# Patient Record
Sex: Female | Born: 1985 | Race: Black or African American | Hispanic: No | Marital: Single | Smoking: Never smoker
Health system: Southern US, Community
[De-identification: ages and names within clinical notes are randomized; demographics above are authoritative.]

## PROBLEM LIST (undated history)

## (undated) DIAGNOSIS — K219 Gastro-esophageal reflux disease without esophagitis: Secondary | ICD-10-CM

## (undated) DIAGNOSIS — E162 Hypoglycemia, unspecified: Secondary | ICD-10-CM

## (undated) DIAGNOSIS — T8859XA Other complications of anesthesia, initial encounter: Secondary | ICD-10-CM

## (undated) DIAGNOSIS — R002 Palpitations: Secondary | ICD-10-CM

## (undated) DIAGNOSIS — R51 Headache: Secondary | ICD-10-CM

## (undated) DIAGNOSIS — T4145XA Adverse effect of unspecified anesthetic, initial encounter: Secondary | ICD-10-CM

## (undated) DIAGNOSIS — R102 Pelvic and perineal pain: Secondary | ICD-10-CM

## (undated) DIAGNOSIS — K449 Diaphragmatic hernia without obstruction or gangrene: Secondary | ICD-10-CM

## (undated) HISTORY — PX: COLONOSCOPY: SHX174

## (undated) HISTORY — PX: KNEE SURGERY: SHX244

## (undated) HISTORY — PX: LAPAROSCOPY: SHX197

## (undated) HISTORY — PX: UPPER GASTROINTESTINAL ENDOSCOPY: SHX188

## (undated) HISTORY — PX: CYSTOSCOPY: SUR368

---
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
## 2005-04-02 ENCOUNTER — Ambulatory Visit: Payer: Self-pay | Admitting: Family Medicine

## 2005-04-09 ENCOUNTER — Ambulatory Visit: Payer: Self-pay | Admitting: Family Medicine

## 2005-04-10 ENCOUNTER — Ambulatory Visit: Payer: Self-pay | Admitting: Family Medicine

## 2006-05-19 ENCOUNTER — Emergency Department (HOSPITAL_COMMUNITY): Admission: EM | Admit: 2006-05-19 | Discharge: 2006-05-19 | Payer: Self-pay | Admitting: Emergency Medicine

## 2006-08-26 ENCOUNTER — Ambulatory Visit: Payer: Self-pay | Admitting: Gastroenterology

## 2006-08-29 ENCOUNTER — Emergency Department: Payer: Self-pay | Admitting: Emergency Medicine

## 2006-09-16 ENCOUNTER — Ambulatory Visit: Payer: Self-pay | Admitting: Gastroenterology

## 2006-10-06 ENCOUNTER — Ambulatory Visit: Payer: Self-pay | Admitting: Family Medicine

## 2006-11-02 ENCOUNTER — Emergency Department (HOSPITAL_COMMUNITY): Admission: EM | Admit: 2006-11-02 | Discharge: 2006-11-02 | Payer: Self-pay | Admitting: Emergency Medicine

## 2007-01-30 ENCOUNTER — Emergency Department: Payer: Self-pay | Admitting: Emergency Medicine

## 2007-03-15 ENCOUNTER — Ambulatory Visit: Payer: Self-pay | Admitting: Obstetrics & Gynecology

## 2007-03-16 ENCOUNTER — Ambulatory Visit: Payer: Self-pay | Admitting: Obstetrics & Gynecology

## 2007-07-07 ENCOUNTER — Ambulatory Visit: Payer: Self-pay | Admitting: Pain Medicine

## 2007-07-30 ENCOUNTER — Ambulatory Visit: Payer: Self-pay | Admitting: Pain Medicine

## 2007-11-06 ENCOUNTER — Other Ambulatory Visit: Payer: Self-pay

## 2007-11-06 ENCOUNTER — Emergency Department: Payer: Self-pay | Admitting: Unknown Physician Specialty

## 2007-12-15 ENCOUNTER — Emergency Department (HOSPITAL_COMMUNITY): Admission: EM | Admit: 2007-12-15 | Discharge: 2007-12-15 | Payer: Self-pay | Admitting: Emergency Medicine

## 2008-03-20 ENCOUNTER — Emergency Department: Payer: Self-pay | Admitting: Emergency Medicine

## 2008-04-25 ENCOUNTER — Emergency Department: Payer: Self-pay | Admitting: Emergency Medicine

## 2009-03-06 ENCOUNTER — Emergency Department: Payer: Self-pay | Admitting: Emergency Medicine

## 2010-10-22 ENCOUNTER — Ambulatory Visit: Payer: Self-pay | Admitting: Family Medicine

## 2010-11-17 ENCOUNTER — Emergency Department: Payer: Self-pay | Admitting: Emergency Medicine

## 2010-11-18 ENCOUNTER — Ambulatory Visit: Payer: Self-pay | Admitting: Family Medicine

## 2010-11-19 LAB — CBC
Hemoglobin: 14.5
MCHC: 33.6
RBC: 4.58
WBC: 12.4 — ABNORMAL HIGH

## 2010-11-19 LAB — URINALYSIS, ROUTINE W REFLEX MICROSCOPIC
Glucose, UA: NEGATIVE
Ketones, ur: >80 — AB
Nitrite: NEGATIVE
Protein, ur: NEGATIVE
Urobilinogen, UA: 1

## 2010-11-19 LAB — COMPREHENSIVE METABOLIC PANEL
ALT: 13
AST: 20
Alkaline Phosphatase: 60
CO2: 27
Calcium: 9.6
Chloride: 104
GFR calc Af Amer: >60
GFR calc non Af Amer: >60
Glucose, Bld: 86
Sodium: 139
Total Bilirubin: 1.9 — ABNORMAL HIGH

## 2010-11-19 LAB — DIFFERENTIAL
Basophils Absolute: 0
Basophils Relative: 0
Eosinophils Absolute: 0
Eosinophils Relative: 0
Lymphs Abs: 1.7
Neutrophils Relative %: 81 — ABNORMAL HIGH

## 2010-11-19 LAB — POCT PREGNANCY, URINE: Preg Test, Ur: NEGATIVE

## 2010-11-19 LAB — URINE MICROSCOPIC-ADD ON

## 2010-11-28 LAB — DIFFERENTIAL
Basophils Absolute: 0
Eosinophils Absolute: 0
Eosinophils Relative: 0
Lymphocytes Relative: 14
Lymphs Abs: 1
Monocytes Absolute: 0.4

## 2010-11-28 LAB — I-STAT 8, (EC8 V) (CONVERTED LAB)
BUN: 12
Glucose, Bld: 84
Hemoglobin: 13.6
Potassium: 3.9
Sodium: 139
pH, Ven: 7.329 — ABNORMAL HIGH

## 2010-11-28 LAB — CBC
HCT: 37.9
Hemoglobin: 12.9
MCV: 90.7
Platelets: 334
RDW: 12.4

## 2011-04-18 ENCOUNTER — Ambulatory Visit: Payer: Self-pay | Admitting: Gastroenterology

## 2011-05-11 ENCOUNTER — Emergency Department: Payer: Self-pay | Admitting: Emergency Medicine

## 2011-05-11 LAB — COMPREHENSIVE METABOLIC PANEL
Albumin: 3.8 g/dL (ref 3.4–5.0)
Anion Gap: 10 (ref 7–16)
Calcium, Total: 8.8 mg/dL (ref 8.5–10.1)
Chloride: 104 mmol/L (ref 98–107)
Creatinine: 0.78 mg/dL (ref 0.60–1.30)
EGFR (African American): >60
Potassium: 3.2 mmol/L — ABNORMAL LOW (ref 3.5–5.1)
SGOT(AST): 24 U/L (ref 15–37)
SGPT (ALT): 18 U/L
Sodium: 140 mmol/L (ref 136–145)

## 2011-05-11 LAB — URINALYSIS, COMPLETE
Bilirubin,UR: NEGATIVE
Glucose,UR: NEGATIVE mg/dL (ref 0–75)
Protein: NEGATIVE
WBC UR: 5 /HPF (ref 0–5)

## 2011-05-11 LAB — CBC
MCHC: 34 g/dL (ref 32.0–36.0)
Platelet: 304 10*3/uL (ref 150–440)
RDW: 12.9 % (ref 11.5–14.5)

## 2011-05-11 LAB — PREGNANCY, URINE: Pregnancy Test, Urine: NEGATIVE m[IU]/mL

## 2011-05-12 LAB — TROPONIN I: Troponin-I: <0.02 ng/mL

## 2011-05-25 ENCOUNTER — Emergency Department: Payer: Self-pay | Admitting: Emergency Medicine

## 2011-05-25 LAB — COMPREHENSIVE METABOLIC PANEL
Alkaline Phosphatase: 54 U/L (ref 50–136)
Anion Gap: 9 (ref 7–16)
BUN: 7 mg/dL (ref 7–18)
Bilirubin,Total: 1.2 mg/dL — ABNORMAL HIGH (ref 0.2–1.0)
Chloride: 106 mmol/L (ref 98–107)
Creatinine: 0.75 mg/dL (ref 0.60–1.30)
Glucose: 83 mg/dL (ref 65–99)
Potassium: 3.8 mmol/L (ref 3.5–5.1)
SGPT (ALT): 15 U/L

## 2011-05-25 LAB — URINALYSIS, COMPLETE
Bilirubin,UR: NEGATIVE
Nitrite: NEGATIVE
Ph: 5 (ref 4.5–8.0)
Protein: NEGATIVE
Specific Gravity: 1.025 (ref 1.003–1.030)
Squamous Epithelial: 7
WBC UR: 2 /HPF (ref 0–5)

## 2011-05-25 LAB — CBC
HCT: 38 % (ref 35.0–47.0)
MCV: 93 fL (ref 80–100)
Platelet: 332 10*3/uL (ref 150–440)
RDW: 12.8 % (ref 11.5–14.5)

## 2011-05-25 LAB — PROTIME-INR
INR: 0.9
Prothrombin Time: 12.6 secs (ref 11.5–14.7)

## 2011-05-26 ENCOUNTER — Inpatient Hospital Stay (HOSPITAL_COMMUNITY)
Admission: AD | Admit: 2011-05-26 | Discharge: 2011-05-26 | Disposition: A | Discharge status: Home / Self Care | Payer: BC Managed Care – PPO | Source: Ambulatory Visit | Attending: Obstetrics and Gynecology | Admitting: Obstetrics and Gynecology

## 2011-05-26 ENCOUNTER — Encounter (HOSPITAL_COMMUNITY): Payer: Self-pay

## 2011-05-26 DIAGNOSIS — K2990 Gastroduodenitis, unspecified, without bleeding: Secondary | ICD-10-CM

## 2011-05-26 DIAGNOSIS — K2970 Gastritis, unspecified, without bleeding: Secondary | ICD-10-CM

## 2011-05-26 DIAGNOSIS — R109 Unspecified abdominal pain: Secondary | ICD-10-CM | POA: Insufficient documentation

## 2011-05-26 DIAGNOSIS — K921 Melena: Secondary | ICD-10-CM

## 2011-05-26 DIAGNOSIS — M545 Low back pain, unspecified: Secondary | ICD-10-CM | POA: Insufficient documentation

## 2011-05-26 DIAGNOSIS — R51 Headache: Secondary | ICD-10-CM | POA: Insufficient documentation

## 2011-05-26 HISTORY — DX: Diaphragmatic hernia without obstruction or gangrene: K44.9

## 2011-05-26 HISTORY — DX: Headache: R51

## 2011-05-26 HISTORY — DX: Pelvic and perineal pain: R10.2

## 2011-05-26 HISTORY — DX: Palpitations: R00.2

## 2011-05-26 HISTORY — DX: Adverse effect of unspecified anesthetic, initial encounter: T41.45XA

## 2011-05-26 HISTORY — DX: Other complications of anesthesia, initial encounter: T88.59XA

## 2011-05-26 HISTORY — DX: Gastro-esophageal reflux disease without esophagitis: K21.9

## 2011-05-26 LAB — URINALYSIS, ROUTINE W REFLEX MICROSCOPIC
Leukocytes, UA: NEGATIVE
Nitrite: NEGATIVE
Specific Gravity, Urine: 1.015 (ref 1.005–1.030)
Urobilinogen, UA: 1 mg/dL (ref 0.0–1.0)
pH: 8 (ref 5.0–8.0)

## 2011-05-26 LAB — CBC
Platelets: 300 10*3/uL (ref 150–400)
RDW: 12.8 % (ref 11.5–15.5)
WBC: 6 10*3/uL (ref 4.0–10.5)

## 2011-05-26 LAB — POCT PREGNANCY, URINE: Preg Test, Ur: NEGATIVE

## 2011-05-26 LAB — OCCULT BLOOD X 1 CARD TO LAB, STOOL: Fecal Occult Bld: NEGATIVE

## 2011-05-26 NOTE — MAU Provider Note (Signed)
History     CSN: [id number]  Arrival date and time: 05/26/11 1637   First Provider Initiated Contact with Patient 05/26/11 1749     26 y.o. G0P0  Chief Complaint  Patient presents with  . Melena  . Headache  . Abdominal Pain   HPI Pt presents to MAU today with report of upper abdominal/epigastric pain x3 months and onset of black tarry stools yesterday, continuing this morning.  Pt reports recent endoscopy in [place] with dx of hiatal hernia and reflux.  She also went to [facility] ER this week for abdominal pain.  She is worried that the black tarry stool means she has internal bleeding and she does not know why she is still having abdominal pain.  Patient's last menstrual period was 05/23/2011. She denies cramping, vaginal itching/burning, urinary symptoms, n/v, or fever/chills.    OB History    Grav Para Term Preterm Abortions TAB SAB Ect Mult Living   0               Past Medical History  Diagnosis Date  . Hiatal hernia   . Acid reflux   . Heart palpitations   . Heart palpitations   . Headache   . Complication of anesthesia     "I don;t wake up easily"  . Pelvic pain     Past Surgical History  Procedure Date  . Colonoscopy   . Laparoscopy   . Cystoscopy   . Upper gastrointestinal endoscopy     Family History  Problem Relation Age of Onset  . Diabetes Mother   . Hypertension Mother     History  Substance Use Topics  . Smoking status: Never Smoker   . Smokeless tobacco: Never Used  . Alcohol Use: No    Allergies: No Known Allergies  Prescriptions prior to admission  Medication Sig Dispense Refill  . amitriptyline (ELAVIL) 25 MG tablet Take 100 mg by mouth at bedtime.      Marland Kitchen Dexlansoprazole (DEXILANT PO) Take 1 tablet by mouth daily.      Marland Kitchen diltiazem (CARDIZEM) 30 MG tablet Take 30 mg by mouth 2 (two) times daily.      . norethindrone-ethinyl estradiol (JUNEL FE,GILDESS FE,LOESTRIN FE) 1-20 MG-MCG tablet Take 1 tablet by mouth daily.        Marland Kitchen OVER THE COUNTER MEDICATION Take 1 tablet by mouth daily. Patient takes over the counter Allegra 180mg         Review of Systems  Constitutional: Negative for fever, chills and malaise/fatigue.  Eyes: Negative for blurred vision.  Respiratory: Negative for cough and shortness of breath.   Cardiovascular: Negative for chest pain.  Gastrointestinal: Positive for abdominal pain, constipation and melena. Negative for heartburn, nausea and vomiting.  Genitourinary: Negative for dysuria, urgency and frequency.  Musculoskeletal: Negative.   Neurological: Positive for headaches. Negative for dizziness.  Psychiatric/Behavioral: Negative for depression.   Physical Exam   Blood pressure 98/59, pulse 74, temperature 98.2 F (36.8 C), temperature source Oral, resp. rate 16, height 5\' 2"  (1.575 m), weight 62.653 kg (138 lb 2 oz), last menstrual period 05/23/2011.  Physical Exam  Nursing note and vitals reviewed. Constitutional: She is oriented to person, place, and time. She appears well-developed and well-nourished.  Neck: Normal range of motion.  Cardiovascular: Normal rate, regular rhythm and normal heart sounds.   Respiratory: Effort normal.  GI: Soft. Bowel sounds are normal. She exhibits no distension and no mass. There is no tenderness. There is no rebound and no  guarding.  Musculoskeletal: Normal range of motion.  Neurological: She is alert and oriented to person, place, and time.  Skin: Skin is warm and dry.  Psychiatric: She has a normal mood and affect. Her behavior is normal. Judgment and thought content normal.    Results for orders placed during the hospital encounter of 05/26/11 (from the past 48 hour(s))  URINALYSIS, ROUTINE W REFLEX MICROSCOPIC     Status: Abnormal   Collection Time   05/26/11  5:00 PM      Component Value Range Comment   Color, Urine YELLOW  YELLOW     APPearance CLEAR  CLEAR     Specific Gravity, Urine 1.015  1.005 - 1.030     pH 8.0  5.0 - 8.0      Glucose, UA NEGATIVE  NEGATIVE (mg/dL)    Hgb urine dipstick NEGATIVE  NEGATIVE     Bilirubin Urine NEGATIVE  NEGATIVE     Ketones, ur 15 (*) NEGATIVE (mg/dL)    Protein, ur NEGATIVE  NEGATIVE (mg/dL)    Urobilinogen, UA 1.0  0.0 - 1.0 (mg/dL)    Nitrite NEGATIVE  NEGATIVE     Leukocytes, UA NEGATIVE  NEGATIVE  MICROSCOPIC NOT DONE ON URINES WITH NEGATIVE PROTEIN, BLOOD, LEUKOCYTES, NITRITE, OR GLUCOSE <1000 mg/dL.  POCT PREGNANCY, URINE     Status: Normal   Collection Time   05/26/11  5:16 PM      Component Value Range Comment   Preg Test, Ur NEGATIVE  NEGATIVE    CBC     Status: Normal   Collection Time   05/26/11  5:40 PM      Component Value Range Comment   WBC 6.0  4.0 - 10.5 (K/uL)    RBC 3.99  3.87 - 5.11 (MIL/uL)    Hemoglobin 12.3  12.0 - 15.0 (g/dL)    HCT 40.9  81.1 - 91.4 (%)    MCV 92.5  78.0 - 100.0 (fL)    MCH 30.8  26.0 - 34.0 (pg)    MCHC 33.3  30.0 - 36.0 (g/dL)    RDW 78.2  95.6 - 21.3 (%)    Platelets 300  150 - 400 (K/uL)   OCCULT BLOOD X 1 CARD TO LAB, STOOL     Status: Normal   Collection Time   05/26/11  6:00 PM      Component Value Range Comment   Fecal Occult Bld NEGATIVE      MAU Course  Procedures U/A, CBC, fecal occult bld  Assessment and Plan  Melena Abdominal pain  Plan to D/C home  Pt to call her GI doctor in am for f/u Return to MAU as needed  Medication List  As of 05/27/2011  7:15 PM   TAKE these medications         amitriptyline 25 MG tablet   Commonly known as: ELAVIL   Take 100 mg by mouth at bedtime.      DEXILANT PO   Take 1 tablet by mouth daily.      diltiazem 30 MG tablet   Commonly known as: CARDIZEM   Take 30 mg by mouth 2 (two) times daily.      norethindrone-ethinyl estradiol 1-20 MG-MCG tablet   Commonly known as: JUNEL FE,GILDESS FE,LOESTRIN FE   Take 1 tablet by mouth daily.      OVER THE COUNTER MEDICATION   Take 1 tablet by mouth daily. Patient takes over the counter Allegra 180mg   Riley, Jackie Rocco 05/26/2011, 6:06 PM

## 2011-05-26 NOTE — Discharge Instructions (Signed)
Gastritis Gastritis is an inflammation (the body's way of reacting to injury and/or infection) of the stomach. It is often caused by viral or bacterial (germ) infections. It can also be caused by chemicals (including alcohol) and medications. This illness may be associated with generalized malaise (feeling tired, not well), cramps, and fever. The illness may last 2 to 7 days. If symptoms of gastritis continue, gastroscopy (looking into the stomach with a telescope-like instrument), biopsy (taking tissue samples), and/or blood tests may be necessary to determine the cause. Antibiotics will not affect the illness unless there is a bacterial infection present. One common bacterial cause of gastritis is an organism known as H. Pylori. This can be treated with antibiotics. Other forms of gastritis are caused by too much acid in the stomach. They can be treated with medications such as H2 blockers and antacids. Home treatment is usually all that is needed. Young children will quickly become dehydrated (loss of body fluids) if vomiting and diarrhea are both present. Medications may be given to control nausea. Medications are usually not given for diarrhea unless especially bothersome. Some medications slow the removal of the virus from the gastrointestinal tract. This slows down the healing process. HOME CARE INSTRUCTIONS Home care instructions for nausea and vomiting:  For adults: drink small amounts of fluids often. Drink at least 2 quarts a day. Take sips frequently. Do not drink large amounts of fluid at one time. This may worsen the nausea.   Only take over-the-counter or prescription medicines for pain, discomfort, or fever as directed by your caregiver.   Drink clear liquids only. Those are anything you can see through such as water, broth, or soft drinks.   Once you are keeping clear liquids down, you may start full liquids, soups, juices, and ice cream or sherbet. Slowly add bland (plain, not spicy)  foods to your diet.  Home care instructions for diarrhea:  Diarrhea can be caused by bacterial infections or a virus. Your condition should improve with time, rest, fluids, and/or anti-diarrheal medication.   Until your diarrhea is under control, you should drink clear liquids often in small amounts. Clear liquids include: water, broth, jell-o water and weak tea.  Avoid:  Milk.   Fruits.   Tobacco.   Alcohol.   Extremely hot or cold fluids.   Too much intake of anything at one time.  When your diarrhea stops you may add the following foods, which help the stool to become more formed:  Rice.   Bananas.   Apples without skin.   Dry toast.  Once these foods are tolerated you may add low-fat yogurt and low-fat cottage cheese. They will help to restore the normal bacterial balance in your bowel. Wash your hands well to avoid spreading bacteria (germ) or virus. SEEK IMMEDIATE MEDICAL CARE IF:   You are unable to keep fluids down.   Vomiting or diarrhea become persistent (constant).   Abdominal pain develops, increases, or localizes. (Right sided pain can be appendicitis. Left sided pain in adults can be diverticulitis.)   You develop a fever (an oral temperature above 102 F (38.9 C)).   Diarrhea becomes excessive or contains blood or mucus.   You have excessive weakness, dizziness, fainting or extreme thirst.   You are not improving or you are getting worse.   You have any other questions or concerns.  Document Released: 01/28/2001 Document Revised: 01/23/2011 Document Reviewed: 02/03/2005 [employer] Patient Information 2012 [employer]. 

## 2011-05-26 NOTE — MAU Note (Signed)
States HA and abdominal pain are the main reasons she is here. States she has been diagnosed with migraines. States she is on a daily medication at home.  States abdominal pain is mid upper abdomen, points to sub-sternal area. States pain is worse after she eats and is sharp. States she had a colonoscopy for rectal pain and was told she had hemorrhoids.

## 2011-05-26 NOTE — MAU Note (Signed)
Patient is here with c/o hedache, lower back and abdominal pain including black tarry stool that started last night. She is on her period which started last Friday.

## 2011-05-28 NOTE — MAU Provider Note (Signed)
Agree with above note.  Syretta Kochel 05/28/2011 8:39 AM

## 2011-07-18 ENCOUNTER — Emergency Department: Payer: Self-pay | Admitting: Emergency Medicine

## 2011-08-18 ENCOUNTER — Emergency Department: Payer: Self-pay | Admitting: Emergency Medicine

## 2011-08-18 LAB — PREGNANCY, URINE: Pregnancy Test, Urine: NEGATIVE m[IU]/mL

## 2012-05-17 ENCOUNTER — Emergency Department: Payer: Self-pay | Admitting: Emergency Medicine

## 2012-05-17 ENCOUNTER — Ambulatory Visit: Payer: Self-pay

## 2012-05-17 LAB — BASIC METABOLIC PANEL
Calcium, Total: 9 mg/dL (ref 8.5–10.1)
Chloride: 106 mmol/L (ref 98–107)
Co2: 24 mmol/L (ref 21–32)
EGFR (African American): >60
EGFR (Non-African Amer.): >60
Osmolality: 278 (ref 275–301)
Potassium: 2.8 mmol/L — ABNORMAL LOW (ref 3.5–5.1)

## 2012-05-17 LAB — CBC
MCH: 31 pg (ref 26.0–34.0)
MCV: 91 fL (ref 80–100)
Platelet: 328 10*3/uL (ref 150–440)
RDW: 12.7 % (ref 11.5–14.5)

## 2012-05-17 LAB — CK TOTAL AND CKMB (NOT AT ARMC): CK-MB: <0.5 ng/mL — ABNORMAL LOW (ref 0.5–3.6)

## 2012-05-17 LAB — SEDIMENTATION RATE: Erythrocyte Sed Rate: 4 mm/hr (ref 0–20)

## 2013-06-05 IMAGING — CR DG CHEST 2V
1 series · 2 of 2 positions shown · non-contrast
Comparison: none

REASON FOR EXAM: Chest Pain
COMMENTS:

[Series 1: pa · 0.17mm/px · 2 of 2 slices shown]
[im 1/2]
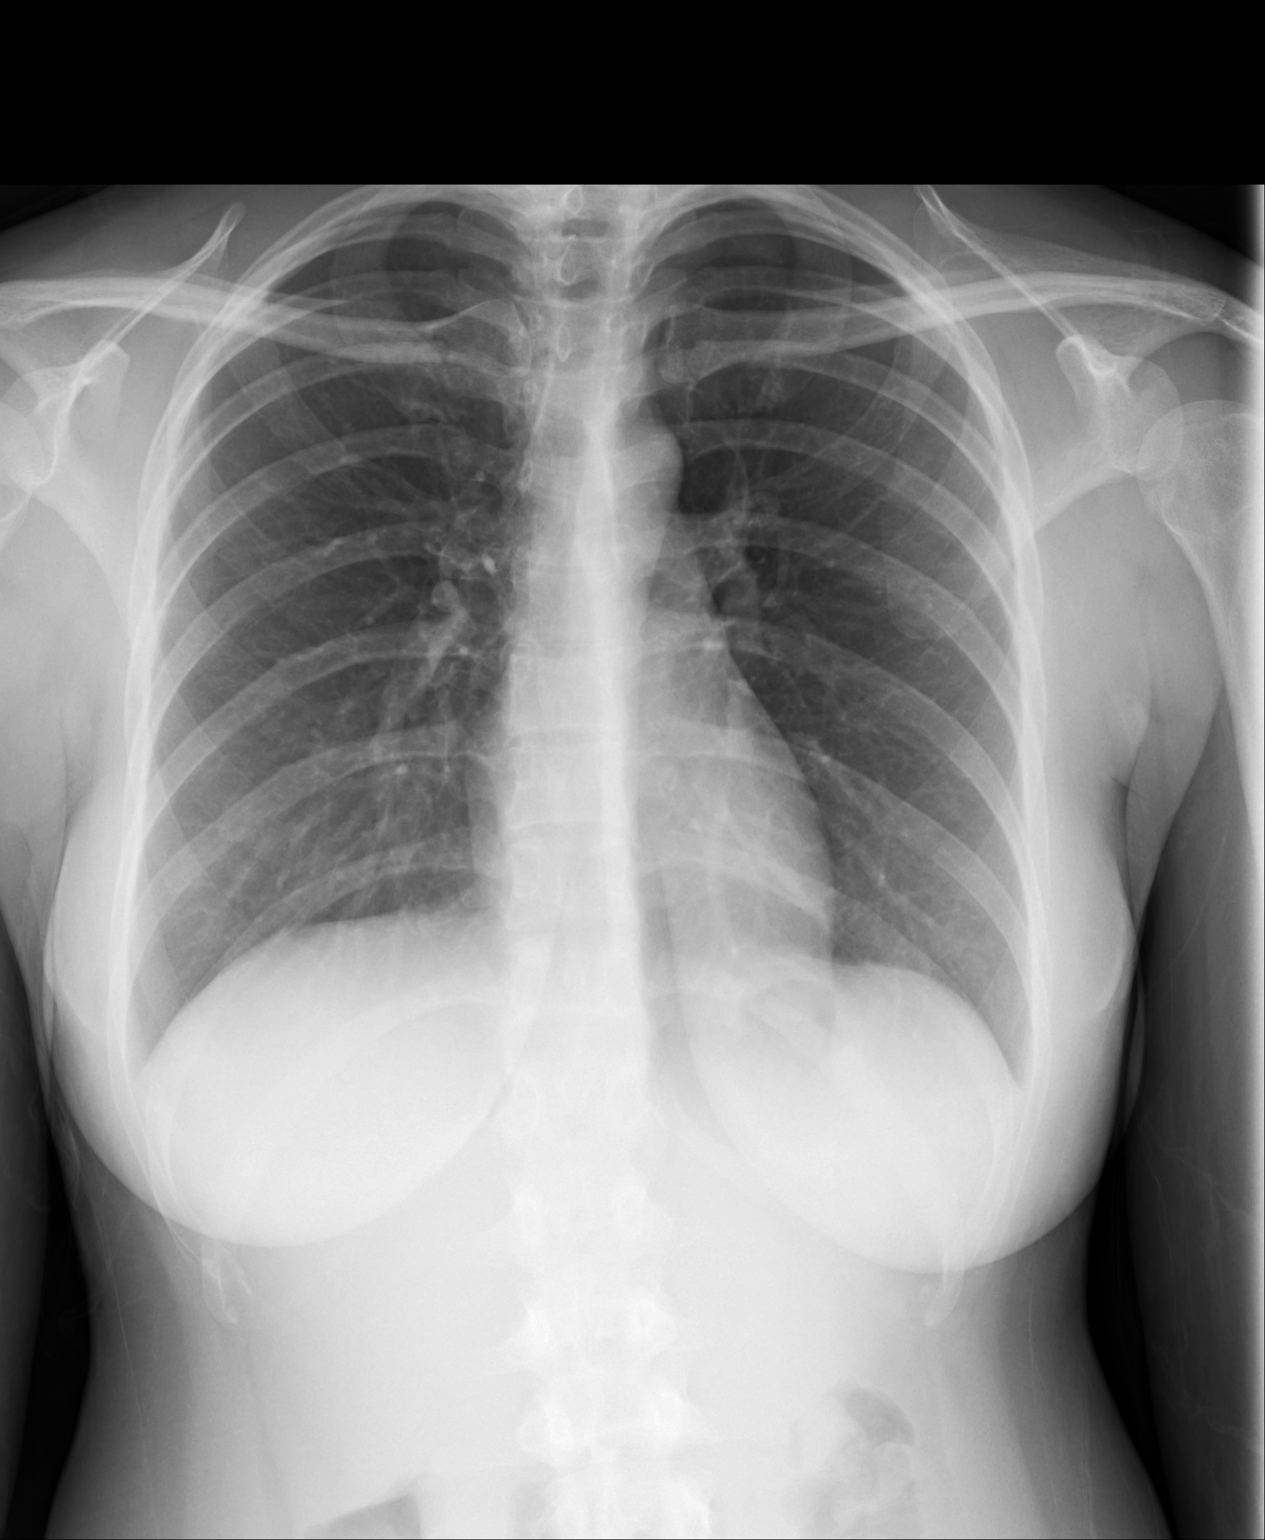
[im 2/2]
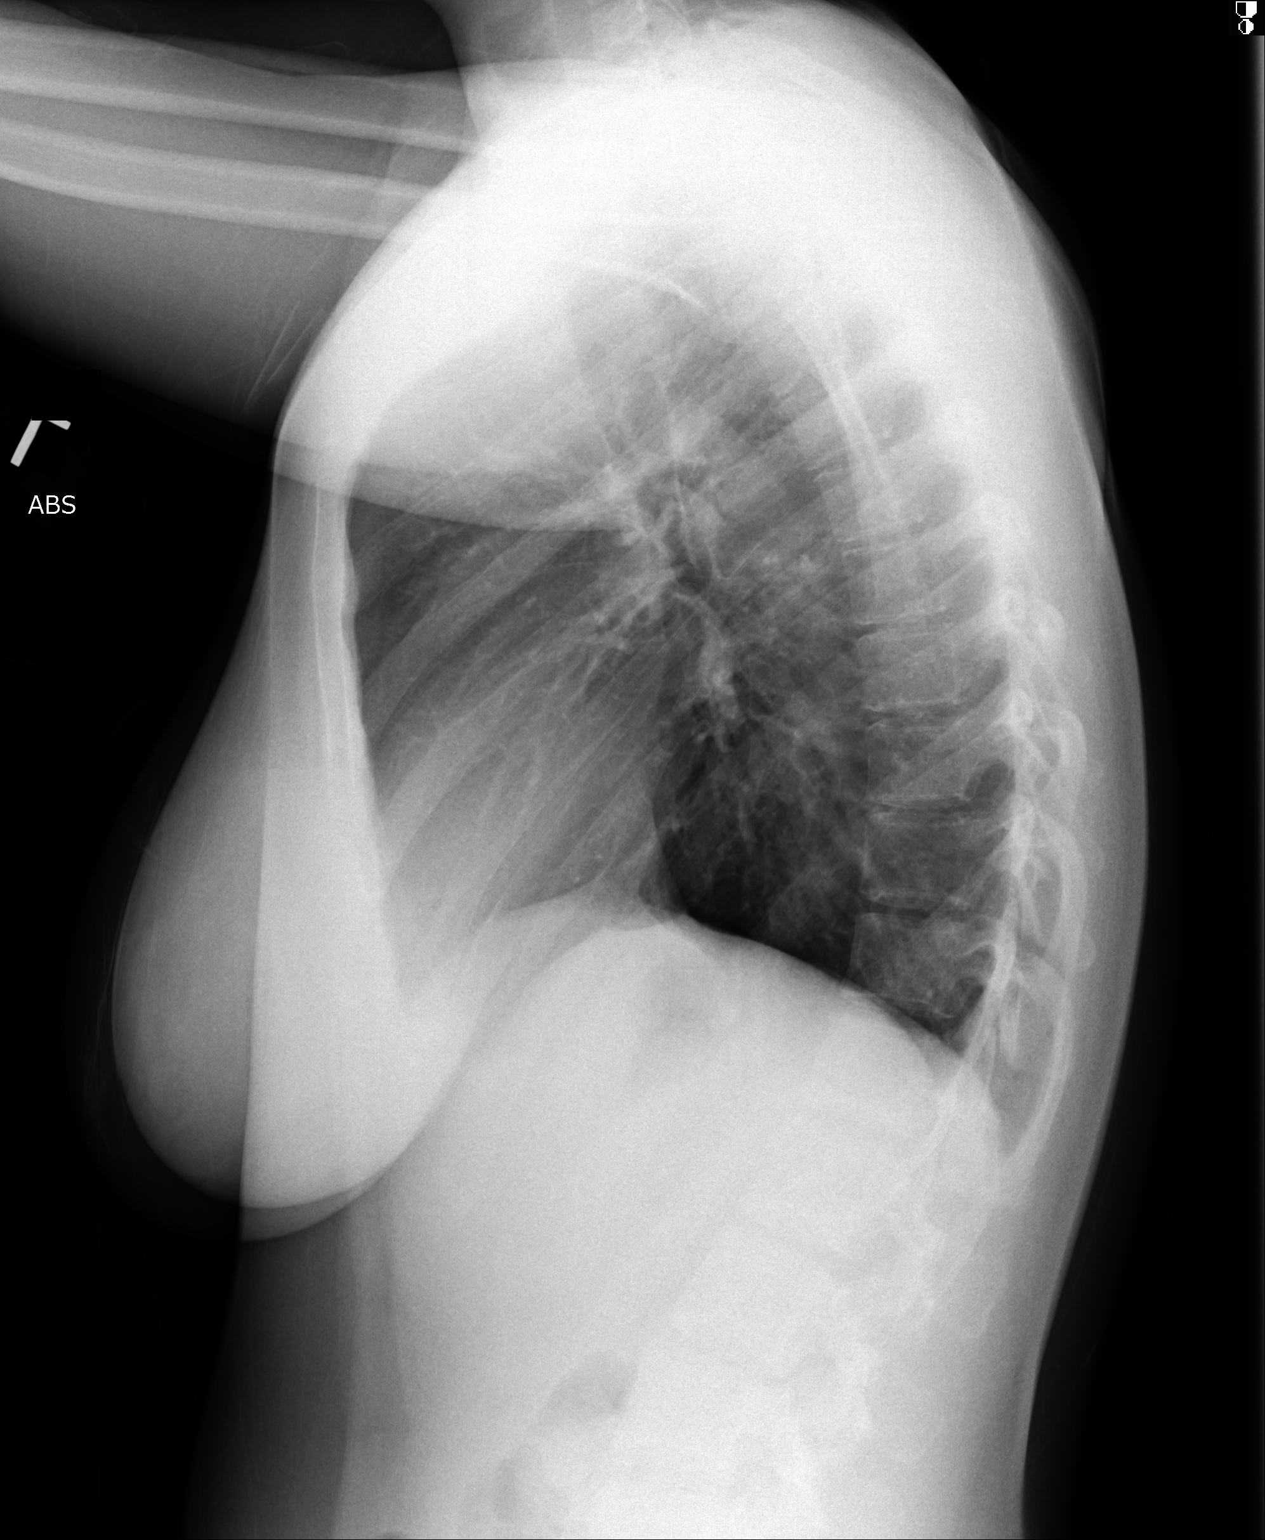

[2 of 2 positions shown; findings below may reference images not displayed]

PROCEDURE:     DXR - DXR CHEST PA (OR AP) AND LATERAL  - May 17, 2012  [DATE]

RESULT:     Comparison is made to the examination 11 May, 2011. The
lungs are clear. The heart and pulmonary vessels are normal. The bony and
mediastinal structures are unremarkable. There is no effusion. There is no
pneumothorax or evidence of congestive failure.
IMPRESSION: No acute cardiopulmonary disease. Stable appearance.

[REDACTED]

## 2013-06-07 ENCOUNTER — Ambulatory Visit: Payer: Self-pay | Admitting: Emergency Medicine

## 2014-08-02 ENCOUNTER — Ambulatory Visit
Admission: EM | Admit: 2014-08-02 | Discharge: 2014-08-02 | Disposition: A | Discharge status: Home / Self Care | Payer: BLUE CROSS/BLUE SHIELD | Attending: Family Medicine | Admitting: Family Medicine

## 2014-08-02 ENCOUNTER — Ambulatory Visit: Payer: BLUE CROSS/BLUE SHIELD

## 2014-08-02 ENCOUNTER — Other Ambulatory Visit: Payer: Self-pay

## 2014-08-02 ENCOUNTER — Encounter: Payer: Self-pay | Admitting: Emergency Medicine

## 2014-08-02 DIAGNOSIS — R079 Chest pain, unspecified: Secondary | ICD-10-CM | POA: Diagnosis not present

## 2014-08-02 DIAGNOSIS — R002 Palpitations: Secondary | ICD-10-CM | POA: Diagnosis not present

## 2014-08-02 LAB — CBC WITH DIFFERENTIAL/PLATELET
BASOS ABS: 0.1 10*3/uL (ref 0–0.1)
BASOS PCT: 1 %
Eosinophils Absolute: 0 10*3/uL (ref 0–0.7)
Eosinophils Relative: 0 %
HEMATOCRIT: 39.4 % (ref 35.0–47.0)
HEMOGLOBIN: 13 g/dL (ref 12.0–16.0)
Lymphocytes Relative: 18 %
Lymphs Abs: 1.5 10*3/uL (ref 1.0–3.6)
MCH: 30.4 pg (ref 26.0–34.0)
MCHC: 33.1 g/dL (ref 32.0–36.0)
MCV: 91.7 fL (ref 80.0–100.0)
MONO ABS: 0.5 10*3/uL (ref 0.2–0.9)
Monocytes Relative: 6 %
NEUTROS PCT: 75 %
Neutro Abs: 6.6 10*3/uL — ABNORMAL HIGH (ref 1.4–6.5)
Platelets: 305 10*3/uL (ref 150–440)
RBC: 4.3 MIL/uL (ref 3.80–5.20)
RDW: 13.4 % (ref 11.5–14.5)
WBC: 8.8 10*3/uL (ref 3.6–11.0)

## 2014-08-02 LAB — BASIC METABOLIC PANEL
Anion gap: 7 (ref 5–15)
BUN: 12 mg/dL (ref 6–20)
CALCIUM: 9 mg/dL (ref 8.9–10.3)
CO2: 28 mmol/L (ref 22–32)
Chloride: 100 mmol/L — ABNORMAL LOW (ref 101–111)
Creatinine, Ser: 0.76 mg/dL (ref 0.44–1.00)
GFR calc Af Amer: >60 mL/min (ref >60)
GFR calc non Af Amer: >60 mL/min (ref >60)
GLUCOSE: 92 mg/dL (ref 65–99)
Potassium: 3.9 mmol/L (ref 3.5–5.1)
Sodium: 135 mmol/L (ref 135–145)

## 2014-08-02 LAB — TSH: TSH: 1.52 u[IU]/mL (ref 0.350–4.500)

## 2014-08-02 LAB — T4, FREE: FREE T4: 1.12 ng/dL (ref 0.61–1.12)

## 2014-08-02 LAB — FIBRIN DERIVATIVES D-DIMER (ARMC ONLY): Fibrin derivatives D-dimer (ARMC): 126 (ref 0–499)

## 2014-08-02 NOTE — ED Notes (Signed)
Has a history of chest pain and heart palpitations for a few years. States it was under control but feels it has gotten worse again over the last couple of months. Herat palpitations, chest pain, SOB, dizziness, and "shakey".  Has a history of PVC's. Chest pain in the center of chest with no radiation. No alleviating or aggravating factors.

## 2014-08-02 NOTE — ED Provider Notes (Addendum)
Patient presents today with history of chest pain and heart palpitations for several years. Patient states that she feels the symptoms are happening more frequently. At times she does have shortness of breath dizziness with the symptoms. She denies any radiation of the pain, nausea, vomiting, headache, weight changes, fever. She has seen a cardiologist in the past and has been diagnosed with PVCs. She denies any smoking history, alcohol use, illicit drug use. Patient denies being on birth control. Admits to having her last menstrual period at the end of May. Family history of von Willebrand's with sister. Patient denies any history of PE. Patient denies any lower extremity swelling or pain. She denies having a workup for thyroid disease in the past.  ROS: Negative except mentioned above.  Vitals as per chart.  GENERAL: NAD HEENT: no pharyngeal erythema, no exudate, no erythema of TMs, no cervical LAD RESP: CTA B CARD: RRR EXTREM: no edema, -Homans NEURO: CN II-XII groslly intact   A/P: Palpitations-labs were essentially normal, EKG showed normal sinus rhythm without PVCs, chest x-ray shows no acute process, TSH is pending along with free T4, a d-dimer was not elevated. Recommend patient follow up with primary care provider, cardiology for further workup if needed. If symptoms worsen patient is to seek immediate medical attention as discussed.  Jolene Provost, MD 08/02/14 1300  Jolene Provost, MD 08/02/14 1300

## 2014-08-02 NOTE — Discharge Instructions (Signed)
If symptoms worsen seek immediate medical attention as discussed.

## 2014-09-24 ENCOUNTER — Ambulatory Visit
Admission: EM | Admit: 2014-09-24 | Discharge: 2014-09-24 | Disposition: A | Discharge status: Home / Self Care | Payer: BLUE CROSS/BLUE SHIELD | Attending: Internal Medicine | Admitting: Internal Medicine

## 2014-09-24 ENCOUNTER — Encounter: Payer: Self-pay | Admitting: Gynecology

## 2014-09-24 DIAGNOSIS — W57XXXA Bitten or stung by nonvenomous insect and other nonvenomous arthropods, initial encounter: Principal | ICD-10-CM

## 2014-09-24 DIAGNOSIS — S90562A Insect bite (nonvenomous), left ankle, initial encounter: Secondary | ICD-10-CM

## 2014-09-24 MED ORDER — HYDROCORTISONE VALERATE 0.2 % EX OINT: 1.0000 "application " | TOPICAL_OINTMENT | Freq: Two times a day (BID) | CUTANEOUS | Status: DC

## 2014-09-24 NOTE — ED Provider Notes (Signed)
CSN: [id number]     Arrival date & time 09/24/14  1119 History   First MD Initiated Contact with Patient 09/24/14 1230     Chief Complaint  Patient presents with  . Blister   HPI  Patient was at work yesterday, at [employer] She felt something sting her on the anterior part of her left ankle. At first, a mosquito bite like lesion came up, was very itchy. Today, there is a 5 mm tense blister at the site, and it sort of hurts. Able to move her ankle fine. No fever. Does have a new tattoo on her left foot, this was placed on July 19.  Past Medical History  Diagnosis Date  . Hiatal hernia   . Acid reflux   . Heart palpitations   . Heart palpitations   . Headache(784.0)   . Complication of anesthesia     "I don;t wake up easily"  . Pelvic pain    Past Surgical History  Procedure Laterality Date  . Colonoscopy    . Laparoscopy    . Cystoscopy    . Upper gastrointestinal endoscopy    . Knee surgery      bilateral   Family History  Problem Relation Age of Onset  . Diabetes Mother   . Hypertension Mother    History  Substance Use Topics  . Smoking status: Never Smoker   . Smokeless tobacco: Never Used  . Alcohol Use: No   OB History    Gravida Para Term Preterm AB TAB SAB Ectopic Multiple Living   0              Review of Systems  All other systems reviewed and are negative.   Allergies  Diltiazem and Nickel  Home Medications   Prior to Admission medications   Medication Sig Start Date End Date Taking? Authorizing Provider  fexofenadine (ALLEGRA) 180 MG tablet Take 180 mg by mouth daily.   Yes Historical Provider, MD  omeprazole (PRILOSEC) 20 MG capsule Take 20 mg by mouth 2 (two) times daily before a meal.   Yes Historical Provider, MD  amitriptyline (ELAVIL) 25 MG tablet Take 100 mg by mouth at bedtime.    Historical Provider, MD  Dexlansoprazole (DEXILANT PO) Take 1 tablet by mouth daily.    Historical Provider, MD  diltiazem (CARDIZEM) 30 MG tablet  Take 30 mg by mouth 2 (two) times daily.    Historical Provider, MD  hydrocortisone valerate ointment (WESTCORT) 0.2 % Apply 1 application topically 2 (two) times daily. 09/24/14   Eustace Moore, MD  norethindrone-ethinyl estradiol (JUNEL FE,GILDESS FE,LOESTRIN FE) 1-20 MG-MCG tablet Take 1 tablet by mouth daily.    Historical Provider, MD  OVER THE COUNTER MEDICATION Take 1 tablet by mouth daily. Patient takes over the counter Allegra     Historical Provider, MD   BP 107/77 mmHg  Pulse 77  Temp(Src) 98.2 F (36.8 C) (Oral)  Resp 18  Ht  (1.575 m)  Wt 153 lb (69.4 kg)  BMI 27.98 kg/m2  SpO2 100%  LMP 09/09/2014 Physical Exam  Constitutional: She is oriented to person, place, and time. No distress.  Alert, nicely groomed  HENT:  Head: Atraumatic.  Eyes:  Conjugate gaze, no eye redness/drainage  Neck: Neck supple.  Cardiovascular: Normal rate.   Pulmonary/Chest: No respiratory distress.  Abdominal: She exhibits no distension.  Musculoskeletal: Normal range of motion.  No leg swelling  Neurological: She is alert and oriented to person, place, and time.  Skin: Skin is warm and dry.  No cyanosis Left anterior ankle with a 1 x 1.5" zone of pale, blanching, erythema with a tense bulla, a little less than half an inch across. Skin is intact. Ankle is not swollen, and ankle range of motion is good. Foot is warm. Dorsal foot has an intricate tattoo, does not appear inflamed  Nursing note and vitals reviewed.   ED Course  Procedures  none  MDM   1. Arthropod bite of ankle, left, initial encounter    Discharge Medication List as of 09/24/2014 12:38 PM    START taking these medications   Details  hydrocortisone valerate ointment (WESTCORT) 0.2 % Apply 1 application topically 2 (two) times daily., Starting 09/24/2014, Until Discontinued, Normal       Recheck if increasing redness/swelling/pain, red streak, new fever >100.5. Anticipate gradual improvement in discomfort over  next several days.     Eustace Moore, MD 09/24/14 [phone]

## 2014-09-24 NOTE — ED Notes (Signed)
Patient c/o blister on left foot notice yesterday. Patient question bug bite.

## 2014-09-24 NOTE — Discharge Instructions (Signed)
Prescription for steroid ointment sent to the [facility] in [place]. Ice may help with discomfort. Anticipate gradual improvement over the next several days.  Insect Bite Mosquitoes, flies, fleas, bedbugs, and many other insects can bite. Insect bites are different from insect stings. A sting is when venom is injected into the skin. Some insect bites can transmit infectious diseases. SYMPTOMS  Insect bites usually turn red, swell, and itch for 2 to 4 days. They often go away on their own. TREATMENT  Your caregiver may prescribe antibiotic medicines if a bacterial infection develops in the bite. HOME CARE INSTRUCTIONS  Do not scratch the bite area.  Keep the bite area clean and dry. Wash the bite area thoroughly with soap and water.  Put ice or cool compresses on the bite area.  Put ice in a plastic bag.  Place a towel between your skin and the bag.  Leave the ice on for 20 minutes, 4 times a day for the first 2 to 3 days, or as directed.  You may apply a baking soda paste, cortisone cream, or calamine lotion to the bite area as directed by your caregiver. This can help reduce itching and swelling.  Only take over-the-counter or prescription medicines as directed by your caregiver.  If you are given antibiotics, take them as directed. Finish them even if you start to feel better. You may need a tetanus shot if:  You cannot remember when you had your last tetanus shot.  You have never had a tetanus shot.  The injury broke your skin. If you get a tetanus shot, your arm may swell, get red, and feel warm to the touch. This is common and not a problem. If you need a tetanus shot and you choose not to have one, there is a rare chance of getting tetanus. Sickness from tetanus can be serious. SEEK IMMEDIATE MEDICAL CARE IF:   You have increased pain, redness, or swelling in the bite area.  You see a red line on the skin coming from the bite.  You have a fever.  You have joint  pain.  You have a headache or neck pain.  You have unusual weakness.  You have a rash.  You have chest pain or shortness of breath.  You have abdominal pain, nausea, or vomiting.  You feel unusually tired or sleepy. MAKE SURE YOU:   Understand these instructions.  Will watch your condition.  Will get help right away if you are not doing well or get worse. Document Released: 03/13/2004 Document Revised: 04/28/2011 Document Reviewed: 09/04/2010 [facility] Patient Information 2015 [place], [place]. This information is not intended to replace advice given to you by your health care provider. Make sure you discuss any questions you have with your health care provider.

## 2015-11-26 ENCOUNTER — Ambulatory Visit
Admission: EM | Admit: 2015-11-26 | Discharge: 2015-11-26 | Disposition: A | Discharge status: Home / Self Care | Payer: BLUE CROSS/BLUE SHIELD | Attending: Family Medicine | Admitting: Family Medicine

## 2015-11-26 ENCOUNTER — Encounter: Payer: Self-pay | Admitting: *Deleted

## 2015-11-26 DIAGNOSIS — J0110 Acute frontal sinusitis, unspecified: Secondary | ICD-10-CM

## 2015-11-26 DIAGNOSIS — J0100 Acute maxillary sinusitis, unspecified: Secondary | ICD-10-CM | POA: Diagnosis not present

## 2015-11-26 MED ORDER — AMOXICILLIN-POT CLAVULANATE 875-125 MG PO TABS: 1.0000 | ORAL_TABLET | Freq: Two times a day (BID) | ORAL | 0 refills | Status: DC

## 2015-11-26 NOTE — ED Provider Notes (Signed)
[facility] ____________________________________________  Time seen: Approximately 1:20 PM  I have reviewed the triage vital signs and the nursing notes.   HISTORY  Chief Complaint Dizziness; Nasal Congestion; and Otalgia   HPI Jackie Riley is a 30 y.o. female presents for the complaints of 2 weeks of runny nose, nasal congestion, bilateral ear discomfort and fullness, postnasal drainage. Patient reports ears feel like they have fluid present states occasional dizziness when she turns her head quickly. Denies current dizziness. Denies fevers. 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 Denies home sick contacts. Patient reports she has tried multiple over-the-counter cough and congestion medications without any resolution. Patient reports she has a history of sinus issues with similar presentation. Denies seasonal allergies.  Denies fevers, chest pain, short suppressed, abdominal pain, dysuria, neck pain, back pain, fevers, rash, extremity pain or actually swelling. Patient reports she feels well otherwise. Denies chest congestion.  Orland Mustard, MD PCP Patient's last menstrual period was 10/31/2015. Denies pregnancy.   Past Medical History:  Diagnosis Date  . Acid reflux   . Complication of anesthesia    "I don;t wake up easily"  . Headache(784.0)   . Heart palpitations   . Heart palpitations   . Hiatal hernia   . Pelvic pain     There are no active problems to display for this patient.   Past Surgical History:  Procedure Laterality Date  . COLONOSCOPY    . CYSTOSCOPY    . KNEE SURGERY     bilateral  . LAPAROSCOPY    . UPPER GASTROINTESTINAL ENDOSCOPY      Current Outpatient Rx  . Order #: [id number] Class: Historical Med  . Order #: [id number] Class: Historical Med  . Order #: [id number] Class: Historical Med  . Order #: [id number] Class: Normal  . Order #: [id number] Class: Historical Med  . Order #: [id number] Class: Historical Med    . Order #: [id number] Class: Normal  . Order #: [id number] Class: Historical Med  . Order #: [id number] Class: Historical Med    No current facility-administered medications for this encounter.   Current Outpatient Prescriptions:  .  fexofenadine (ALLEGRA) 180 MG tablet, Take 180 mg by mouth daily., Disp: , Rfl:  .  omeprazole (PRILOSEC) 20 MG capsule, Take 20 mg by mouth 2 (two) times daily before a meal., Disp: , Rfl:  .  amitriptyline (ELAVIL) 25 MG tablet, Take 100 mg by mouth at bedtime., Disp: , Rfl:  .  amoxicillin-clavulanate (AUGMENTIN) 875-125 MG tablet, Take 1 tablet by mouth every 12 (twelve) hours., Disp: 20 tablet, Rfl: 0 .  Dexlansoprazole (DEXILANT PO), Take 1 tablet by mouth daily., Disp: , Rfl:  .  diltiazem (CARDIZEM) 30 MG tablet, Take 30 mg by mouth 2 (two) times daily., Disp: , Rfl:  .  hydrocortisone valerate ointment (WESTCORT) 0.2 %, Apply 1 application topically 2 (two) times daily., Disp: 45 g, Rfl: 0 .  norethindrone-ethinyl estradiol (JUNEL FE,GILDESS FE,LOESTRIN FE) 1-20 MG-MCG tablet, Take 1 tablet by mouth daily., Disp: , Rfl:  .  OVER THE COUNTER MEDICATION, Take 1 tablet by mouth daily. Patient takes over the counter Allegra 180mg , Disp: , Rfl:   Allergies Diltiazem and Nickel  Family History  Problem Relation Age of Onset  . Diabetes Mother   . Hypertension Mother     Social History Social History  Substance Use Topics  . Smoking status: Never Smoker  . Smokeless tobacco: Never Used  . Alcohol use No    Review of Systems Constitutional: No  fever/chills Eyes: No visual changes. ENT: As above. Cardiovascular: Denies chest pain. Respiratory: Denies shortness of breath. Gastrointestinal: No abdominal pain.  No nausea, no vomiting.  No diarrhea.  No constipation. Genitourinary: Negative for dysuria. Musculoskeletal: Negative for back pain. Skin: Negative for rash. Neurological: Negative for headaches, focal weakness or numbness.  10-point ROS  otherwise negative.  ____________________________________________   PHYSICAL EXAM:  VITAL SIGNS: ED Triage Vitals  Enc Vitals Group     BP 11/26/15 1253 111/77     Pulse Rate 11/26/15 1253 81     Resp 11/26/15 1253 16     Temp 11/26/15 1253 99.3 F (37.4 C)     Temp Source 11/26/15 1253 Oral     SpO2 11/26/15 1253 100 %     Weight 11/26/15 1256 170 lb (77.1 kg)     Height 11/26/15 1256 5\' 2"  (1.575 m)     Head Circumference --      Peak Flow --      Pain Score 11/26/15 1259 0     Pain Loc --      Pain Edu? --      Excl. in GC? --     Constitutional: Alert and oriented. Well appearing and in no acute distress. Eyes: Conjunctivae are normal. PERRL. EOMI. Head: Atraumatic.Mild tenderness to palpation bilateral frontal and maxillary sinuses. No swelling. No erythema.   Ears: no erythema, diffuse and present bilaterally, otherwise normal TMs bilaterally. Bilateral ears nontender.  Nose: nasal congestion with bilateral nasal turbinate erythema and edema.   Mouth/Throat: Mucous membranes are moist.  Oropharynx non-erythematous.No tonsillar swelling or exudate.  Neck: No stridor.  No cervical spine tenderness to palpation. Hematological/Lymphatic/Immunilogical: No cervical lymphadenopathy. Cardiovascular: Normal rate, regular rhythm. Grossly normal heart sounds.  Good peripheral circulation. Respiratory: Normal respiratory effort.  No retractions. Lungs CTAB. No wheezes, rales or rhonchi. Good air movement.  Gastrointestinal: Soft and nontender. No CVA tenderness. Musculoskeletal: Ambulatory with steady gait. No cervical, thoracic or lumbar tenderness to palpation.  Neurologic:  Normal speech and language. No gross focal neurologic deficits are appreciated. No gait instability. Skin:  Skin is warm, dry and intact. No rash noted. Psychiatric: Mood and affect are normal. Speech and behavior are normal.  ___________________________________________   LABS (all labs ordered are  listed, but only abnormal results are displayed)  Labs Reviewed - No data to display ____________________________________________  PROCEDURES Procedures    INITIAL IMPRESSION / ASSESSMENT AND PLAN / ED COURSE  Pertinent labs & imaging results that were available during my care of the patient were reviewed by me and considered in my medical decision making (see chart for details).  Very well-appearing patient. No acute distress. Presenting for nasal congestion sinus congestion for the last 2 weeks. Suspect symptoms sinusitis. Will treat patient with oral Augmentin. Encouraged supportive care. Rest, fluids.Discussed indication, risks and benefits of medications with patient.  Discussed follow up with Primary care physician this week. Discussed follow up and return parameters including no resolution or any worsening concerns. Patient verbalized understanding and agreed to plan.   ____________________________________________   FINAL CLINICAL IMPRESSION(S) / ED DIAGNOSES  Final diagnoses:  Acute maxillary sinusitis, recurrence not specified  Acute frontal sinusitis, recurrence not specified     Discharge Medication List as of 11/26/2015  1:28 PM    START taking these medications   Details  amoxicillin-clavulanate (AUGMENTIN) 875-125 MG tablet Take 1 tablet by mouth every 12 (twelve) hours., Starting Mon 11/26/2015, Normal        Note:  This dictation was prepared with Dragon dictation along with smaller phrase technology. Any transcriptional errors that result from this process are unintentional.    Clinical Course      Renford Dills, NP 11/26/15 [phone]

## 2015-11-26 NOTE — ED Triage Notes (Signed)
Patient started having symptoms of nasal congestion, bilateral ear pain, weakness, and dizziness 2 weeks ago.

## 2015-11-26 NOTE — Discharge Instructions (Signed)
Take medication as prescribed. Rest. Drink plenty of fluids.  ° °Follow up with your primary care physician this week as needed. Return to Urgent care for new or worsening concerns.  ° °

## 2017-03-24 ENCOUNTER — Ambulatory Visit
Admission: EM | Admit: 2017-03-24 | Discharge: 2017-03-24 | Disposition: A | Discharge status: Home / Self Care | Payer: BLUE CROSS/BLUE SHIELD | Attending: Family Medicine | Admitting: Family Medicine

## 2017-03-24 ENCOUNTER — Encounter: Payer: Self-pay | Admitting: *Deleted

## 2017-03-24 DIAGNOSIS — J069 Acute upper respiratory infection, unspecified: Secondary | ICD-10-CM | POA: Diagnosis not present

## 2017-03-24 MED ORDER — FLUTICASONE PROPIONATE 50 MCG/ACT NA SUSP: 2.0000 | Freq: Every day | NASAL | 0 refills | Status: AC

## 2017-03-24 MED ORDER — PSEUDOEPHEDRINE-GUAIFENESIN ER 120-1200 MG PO TB12: 1.0000 | ORAL_TABLET | Freq: Two times a day (BID) | ORAL | 0 refills | Status: DC

## 2017-03-24 NOTE — ED Triage Notes (Signed)
Cough, fever, body aches, chills, facial pain, since last Friday.

## 2017-03-24 NOTE — ED Provider Notes (Signed)
[facility]    CSN: [id number] Arrival date & time: 03/24/17  1558     History   Chief Complaint Chief Complaint  Patient presents with  . Cough  . Facial Pain  . Fever  . Generalized Body Aches  . Chills    HPI Aanika KENSLY BOWMER is a 32 y.o. female.   HPI  32 year old female presents with the cough which is very minimal ,fever of 99.9 degrees, body aches, chills and facial pain that she has had since Friday 4 days prior to this visit.  She states that the cough does not seem to bother her as much.  Her main problem is that of congestion in her head and a feeling of fullness.  She is afebrile, O2 sats on room air 100%, blood pressure 119/81.  He has had her discharge from her left eye.  There is no evidence of conjunctivitis      Past Medical History:  Diagnosis Date  . Acid reflux   . Complication of anesthesia    "I don;t wake up easily"  . Headache(784.0)   . Heart palpitations   . Heart palpitations   . Hiatal hernia   . Pelvic pain     There are no active problems to display for this patient.   Past Surgical History:  Procedure Laterality Date  . COLONOSCOPY    . CYSTOSCOPY    . KNEE SURGERY     bilateral  . LAPAROSCOPY    . UPPER GASTROINTESTINAL ENDOSCOPY      OB History    Gravida Para Term Preterm AB Living   0             SAB TAB Ectopic Multiple Live Births                   Home Medications    Prior to Admission medications   Medication Sig Start Date End Date Taking? Authorizing Provider  fexofenadine (ALLEGRA) 180 MG tablet Take 180 mg by mouth daily.   Yes [provider]  norethindrone-ethinyl estradiol (JUNEL FE,GILDESS FE,LOESTRIN FE) 1-20 MG-MCG tablet Take 1 tablet by mouth daily.   Yes [provider]  omeprazole (PRILOSEC) 20 MG capsule Take 20 mg by mouth 2 (two) times daily before a meal.   Yes [provider]  fluticasone (FLONASE) 50 MCG/ACT nasal spray Place 2 sprays into both  nostrils daily. 03/24/17   Lutricia Feil, PA-C  OVER THE COUNTER MEDICATION Take 1 tablet by mouth daily. Patient takes over the counter Allegra 180mg     [provider]  Pseudoephedrine-Guaifenesin (MUCINEX D MAX STRENGTH) [phone] MG TB12 Take 1 tablet by mouth 2 (two) times daily. 03/24/17   Lutricia Feil, PA-C    Family History Family History  Problem Relation Age of Onset  . Diabetes Mother   . Hypertension Mother   . Healthy Father     Social History Social History   Tobacco Use  . Smoking status: Never Smoker  . Smokeless tobacco: Never Used  Substance Use Topics  . Alcohol use: No  . Drug use: No     Allergies   Diltiazem and Nickel   Review of Systems Review of Systems  Constitutional: Negative for activity change, appetite change, chills, fatigue and fever.  HENT: Positive for congestion, sinus pressure and sinus pain.   Respiratory: Positive for cough.   All other systems reviewed and are negative.    Physical Exam Triage Vital Signs ED Triage  Vitals  Enc Vitals Group     BP 03/24/17 1646 119/81     Pulse Rate 03/24/17 1646 82     Resp 03/24/17 1646 16     Temp 03/24/17 1646 98.8 F (37.1 C)     Temp Source 03/24/17 1646 Oral     SpO2 03/24/17 1646 100 %     Weight 03/24/17 1648 176 lb (79.8 kg)     Height 03/24/17 1648 5\' 2"  (1.575 m)     Head Circumference --      Peak Flow --      Pain Score --      Pain Loc --      Pain Edu? --      Excl. in GC? --    No data found.  Updated Vital Signs BP 119/81 (BP Location: Left Arm)   Pulse 82   Temp 98.8 F (37.1 C) (Oral)   Resp 16   Ht 5\' 2"  (1.575 m)   Wt 176 lb (79.8 kg)   LMP 02/27/2017 (Exact Date)   SpO2 100%   BMI 32.19 kg/m   Visual Acuity Right Eye Distance:   Left Eye Distance:   Bilateral Distance:    Right Eye Near:   Left Eye Near:    Bilateral Near:     Physical Exam  Constitutional: She is oriented to person, place, and time. She appears well-developed  and well-nourished. No distress.  HENT:  Head: Normocephalic.  Right Ear: External ear normal.  Left Ear: External ear normal.  Nose: Nose normal.  Mouth/Throat: Oropharynx is clear and moist. No oropharyngeal exudate.  Eyes: Pupils are equal, round, and reactive to light. Right eye exhibits no discharge. Left eye exhibits discharge.  Neck: Normal range of motion.  Pulmonary/Chest: Effort normal and breath sounds normal.  Musculoskeletal: Normal range of motion.  Lymphadenopathy:    She has no cervical adenopathy.  Neurological: She is alert and oriented to person, place, and time.  Skin: Skin is warm and dry. She is not diaphoretic.  Psychiatric: She has a normal mood and affect. Her behavior is normal. Judgment and thought content normal.  Nursing note and vitals reviewed.    UC Treatments / Results  Labs (all labs ordered are listed, but only abnormal results are displayed) Labs Reviewed - No data to display  EKG  EKG Interpretation None       Radiology No results found.  Procedures Procedures (including critical care time)  Medications Ordered in UC Medications - No data to display   Initial Impression / Assessment and Plan / UC Course  I have reviewed the triage vital signs and the nursing notes.  Pertinent labs & imaging results that were available during my care of the patient were reviewed by me and considered in my medical decision making (see chart for details).     Plan: 1. Test/x-ray results and diagnosis reviewed with patient 2. rx as per orders; risks, benefits, potential side effects reviewed with patient 3. Recommend supportive treatment with rest/ fluids.  Motrin for body aches and fever.  I have advised her this is most likely a virus and does not require antibiotics at this time.  Use Flonase which is  to promote drainage and she should use this for 3-4 weeks.  Instructions were given to the patient.  Worsens or is not improving she should  follow-up with primary care or she may return to our office. 4. F/u prn if symptoms worsen or don't improve  Final Clinical Impressions(s) / UC Diagnoses   Final diagnoses:  Upper respiratory tract infection, unspecified type    ED Discharge Orders        Ordered    fluticasone (FLONASE) 50 MCG/ACT nasal spray  Daily     03/24/17 1820    Pseudoephedrine-Guaifenesin (MUCINEX D MAX STRENGTH) [phone] MG TB12  2 times daily     03/24/17 1820       Controlled Substance Prescriptions [place] Controlled Substance Registry consulted? Not Applicable   Lutricia Feil, PA-C 03/24/17 1839

## 2017-08-16 ENCOUNTER — Other Ambulatory Visit: Payer: Self-pay

## 2017-08-16 ENCOUNTER — Ambulatory Visit (INDEPENDENT_AMBULATORY_CARE_PROVIDER_SITE_OTHER): Payer: 59

## 2017-08-16 ENCOUNTER — Ambulatory Visit
Admission: EM | Admit: 2017-08-16 | Discharge: 2017-08-16 | Disposition: A | Discharge status: Home / Self Care | Payer: 59 | Attending: Family Medicine | Admitting: Family Medicine

## 2017-08-16 DIAGNOSIS — M79674 Pain in right toe(s): Secondary | ICD-10-CM | POA: Diagnosis not present

## 2017-08-16 MED ORDER — MELOXICAM 7.5 MG PO TABS: 7.5000 mg | ORAL_TABLET | Freq: Every day | ORAL | 0 refills | Status: DC

## 2017-08-16 NOTE — ED Triage Notes (Addendum)
Pt reports one week ago stood up and felt her right great toe pop. Hurts to walk on it and radiates into her foot.  Pain 8/10

## 2017-08-16 NOTE — Discharge Instructions (Signed)
Etiology of toe pain is nonspecific at this time.  X-ray does not show any fracture, however based on description of "popping", have some suspicion that perhaps sprained a tendon.  Since your job requires to be on your feet, I would definitely advise you to wear comfortable, supportive shoes.  Also considering plantar fasciitis as an alternative at this time.  In either case, I think we should treat conservatively with anti-inflammatories, icing.  Rest is also important.  Mobic WITH FOOD.  Return further evaluations if no improvement or if pain persists.

## 2017-08-16 NOTE — ED Provider Notes (Signed)
[facility]    CSN: [id number] Arrival date & time: 08/16/17  1444     History   Chief Complaint Chief Complaint  Patient presents with  . Toe Injury    HPI Jackie Riley is a 32 y.o. female.   CC: right toe pain x one week, unchanged.  Stood up from the floor an dgot up with hands and when stood on right foot, felt a 'pop.' describes as 'deep ache.'  No redness, fever, N, v. Skin intact. Had been swollen, this has improved. Ibuprofen without relief. No ice. Pain worse when standing on it. Supportive shoes helpful.  Works in childcare  No h/o gout, Dm, kidney disease.    No h/o GIB       Past Medical History:  Diagnosis Date  . Acid reflux   . Complication of anesthesia    "I don;t wake up easily"  . Headache(784.0)   . Heart palpitations   . Heart palpitations   . Hiatal hernia   . Pelvic pain     There are no active problems to display for this patient.   Past Surgical History:  Procedure Laterality Date  . COLONOSCOPY    . CYSTOSCOPY    . KNEE SURGERY     bilateral  . LAPAROSCOPY    . UPPER GASTROINTESTINAL ENDOSCOPY      OB History    Gravida  0   Para      Term      Preterm      AB      Living        SAB      TAB      Ectopic      Multiple      Live Births               Home Medications    Prior to Admission medications   Medication Sig Start Date End Date Taking? Authorizing Provider  fexofenadine (ALLEGRA) 180 MG tablet Take 180 mg by mouth daily.    [provider]  fluticasone (FLONASE) 50 MCG/ACT nasal spray Place 2 sprays into both nostrils daily. 03/24/17   Lutricia Feiloemer, William P, PA-C  meloxicam (MOBIC) 7.5 MG tablet Take 1 tablet (7.5 mg total) by mouth daily. Take with food 08/16/17   Allegra GranaArnett, Bernal Luhman G, FNP  norethindrone-ethinyl estradiol (JUNEL FE,GILDESS FE,LOESTRIN FE) 1-20 MG-MCG tablet Take 1 tablet by mouth daily.    [provider]  omeprazole (PRILOSEC) 20 MG capsule  Take 20 mg by mouth 2 (two) times daily before a meal.    [provider]  OVER THE COUNTER MEDICATION Take 1 tablet by mouth daily. Patient takes over the counter Allegra 180mg     [provider]  Pseudoephedrine-Guaifenesin (MUCINEX D MAX STRENGTH) [phone] MG TB12 Take 1 tablet by mouth 2 (two) times daily. 03/24/17   Lutricia Feiloemer, William P, PA-C    Family History Family History  Problem Relation Age of Onset  . Diabetes Mother   . Hypertension Mother   . Healthy Father     Social History Social History   Tobacco Use  . Smoking status: Never Smoker  . Smokeless tobacco: Never Used  Substance Use Topics  . Alcohol use: No  . Drug use: No     Allergies   Diltiazem and Nickel   Review of Systems Review of Systems  Constitutional: Negative for chills and fever.  Cardiovascular: Negative for chest pain.  Gastrointestinal: Negative for nausea and vomiting.  Musculoskeletal: Positive for joint swelling (toe).     Physical Exam Triage Vital Signs ED Triage Vitals  Enc Vitals Group     BP 08/16/17 1509 (!) 118/91     Pulse Rate 08/16/17 1509 79     Resp 08/16/17 1509 18     Temp 08/16/17 1509 98.6 F (37 C)     Temp Source 08/16/17 1509 Oral     SpO2 08/16/17 1509 100 %     Weight 08/16/17 1511 172 lb (78 kg)     Height 08/16/17 1511 5\' 2"  (1.575 m)     Head Circumference --      Peak Flow --      Pain Score 08/16/17 1509 8     Pain Loc --      Pain Edu? --      Excl. in GC? --    No data found.  Updated Vital Signs BP (!) 118/91 (BP Location: Left Arm)   Pulse 79   Temp 98.6 F (37 C) (Oral)   Resp 18   Ht 5\' 2"  (1.575 m)   Wt 172 lb (78 kg)   LMP 08/14/2017   SpO2 100%   BMI 31.46 kg/m   Visual Acuity Right Eye Distance:   Left Eye Distance:   Bilateral Distance:    Right Eye Near:   Left Eye Near:    Bilateral Near:     Physical Exam  Constitutional: She appears well-developed and well-nourished.  Eyes: Conjunctivae are  normal.  Cardiovascular: Normal rate, regular rhythm, normal heart sounds and normal pulses.  Pulmonary/Chest: Effort normal and breath sounds normal. She has no wheezes. She has no rhonchi. She has no rales.  Musculoskeletal:       Right ankle: She exhibits normal range of motion and no swelling. No tenderness.       Right foot: There is decreased range of motion. There is no tenderness, no bony tenderness, no swelling and normal capillary refill.       Feet:  Able to pointer flex and dorsiflex against resistance however she notes pain when doing so. No erythema, increased warmth, podagra.    Neurological: She is alert.  Skin: Skin is warm and dry.  Psychiatric: She has a normal mood and affect. Her speech is normal and behavior is normal. Thought content normal.  Vitals reviewed.    UC Treatments / Results  Labs (all labs ordered are listed, but only abnormal results are displayed) Labs Reviewed - No data to display  EKG None  Radiology Dg Foot Complete Right  Result Date: 08/16/2017 CLINICAL DATA:  Nontraumatic great toe pain. EXAM: RIGHT FOOT COMPLETE - 3+ VIEW COMPARISON:  None FINDINGS: No fractures or dislocations. No cause for the patient's great toe pain identified. IMPRESSION: Negative. Electronically Signed   By: Gerome Sam III M.D   On: 08/16/2017 15:51    Procedures Procedures (including critical care time)  Medications Ordered in UC Medications - No data to display  Initial Impression / Assessment and Plan / UC Course  I have reviewed the triage vital signs and the nursing notes.  Pertinent labs & imaging results that were available during my care of the patient were reviewed by me and considered in my medical decision making (see chart for details).      Final Clinical Impressions(s) / UC Diagnoses   Final diagnoses:  Toe pain, right  Etiology of pain is nonspecific at this time.  I am reassured as no signs or  symptoms to indicate infection.  I  suspect muscular ( perhaps tendon) etiology. Negative xray. Discussed conservative therapy , trial of mobic, ice. Patient in agreement. Return precautions given.    Discharge Instructions     Etiology of toe pain is nonspecific at this time.  X-ray does not show any fracture, however based on description of "popping", have some suspicion that perhaps sprained a tendon.  Since your job requires to be on your feet, I would definitely advise you to wear comfortable, supportive shoes.  Also considering plantar fasciitis as an alternative at this time.  In either case, I think we should treat conservatively with anti-inflammatories, icing.  Rest is also important.  Mobic WITH FOOD.  Return further evaluations if no improvement or if pain persists.    ED Prescriptions    Medication Sig Dispense Auth. Provider   meloxicam (MOBIC) 7.5 MG tablet Take 1 tablet (7.5 mg total) by mouth daily. Take with food 20 tablet Allegra Grana, FNP     Controlled Substance Prescriptions [place] Controlled Substance Registry consulted? Not Applicable   Allegra Grana, FNP 08/16/17 [phone]

## 2018-03-08 ENCOUNTER — Other Ambulatory Visit: Payer: Self-pay

## 2018-03-08 ENCOUNTER — Ambulatory Visit
Admission: EM | Admit: 2018-03-08 | Discharge: 2018-03-08 | Disposition: A | Discharge status: Home / Self Care | Payer: 59 | Attending: Family Medicine | Admitting: Family Medicine

## 2018-03-08 DIAGNOSIS — R509 Fever, unspecified: Secondary | ICD-10-CM | POA: Diagnosis not present

## 2018-03-08 DIAGNOSIS — J111 Influenza due to unidentified influenza virus with other respiratory manifestations: Secondary | ICD-10-CM

## 2018-03-08 DIAGNOSIS — M791 Myalgia, unspecified site: Secondary | ICD-10-CM | POA: Diagnosis not present

## 2018-03-08 DIAGNOSIS — R51 Headache: Secondary | ICD-10-CM

## 2018-03-08 DIAGNOSIS — R69 Illness, unspecified: Secondary | ICD-10-CM | POA: Insufficient documentation

## 2018-03-08 MED ORDER — OSELTAMIVIR PHOSPHATE 75 MG PO CAPS: 75.0000 mg | ORAL_CAPSULE | Freq: Two times a day (BID) | ORAL | 0 refills | Status: DC

## 2018-03-08 NOTE — ED Triage Notes (Signed)
"  I feel like I've been run over by a truck." Sick since Saturday. Pain 8/10

## 2018-03-08 NOTE — ED Provider Notes (Signed)
[facility]    CSN: [id number] Arrival date & time: 03/08/18  1110     History   Chief Complaint Chief Complaint  Patient presents with  . Generalized Body Aches  . Nasal Congestion    HPI Jackie Riley is a 33 y.o. female.   The history is provided by the patient.  URI  Presenting symptoms: congestion, fatigue and fever   Severity:  Moderate Onset quality:  Sudden Duration:  2 days Timing:  Constant Progression:  Worsening Chronicity:  New Relieved by:  Nothing Ineffective treatments:  OTC medications Associated symptoms: headaches and myalgias   Associated symptoms: no wheezing   Risk factors: sick contacts     Past Medical History:  Diagnosis Date  . Acid reflux   . Complication of anesthesia    "I don;t wake up easily"  . Headache(784.0)   . Heart palpitations   . Heart palpitations   . Hiatal hernia   . Pelvic pain     There are no active problems to display for this patient.   Past Surgical History:  Procedure Laterality Date  . COLONOSCOPY    . CYSTOSCOPY    . KNEE SURGERY     bilateral  . LAPAROSCOPY    . UPPER GASTROINTESTINAL ENDOSCOPY      OB History    Gravida  0   Para      Term      Preterm      AB      Living        SAB      TAB      Ectopic      Multiple      Live Births               Home Medications    Prior to Admission medications   Medication Sig Start Date End Date Taking? Authorizing Provider  fexofenadine (ALLEGRA) 180 MG tablet Take 180 mg by mouth daily.    [provider]  fluticasone (FLONASE) 50 MCG/ACT nasal spray Place 2 sprays into both nostrils daily. 03/24/17   Lutricia Feiloemer, William P, PA-C  meloxicam (MOBIC) 7.5 MG tablet Take 1 tablet (7.5 mg total) by mouth daily. Take with food 08/16/17   Allegra GranaArnett, Margaret G, FNP  norethindrone-ethinyl estradiol (JUNEL FE,GILDESS FE,LOESTRIN FE) 1-20 MG-MCG tablet Take 1 tablet by mouth daily.    [provider]    omeprazole (PRILOSEC) 20 MG capsule Take 20 mg by mouth 2 (two) times daily before a meal.    [provider]  oseltamivir (TAMIFLU) 75 MG capsule Take 1 capsule (75 mg total) by mouth 2 (two) times daily. 03/08/18   Payton Mccallumonty, Henessy Rohrer, MD  OVER THE COUNTER MEDICATION Take 1 tablet by mouth daily. Patient takes over the counter Allegra 180mg     [provider]  Pseudoephedrine-Guaifenesin (MUCINEX D MAX STRENGTH) [phone] MG TB12 Take 1 tablet by mouth 2 (two) times daily. 03/24/17   Lutricia Feiloemer, William P, PA-C    Family History Family History  Problem Relation Age of Onset  . Diabetes Mother   . Hypertension Mother   . Healthy Father     Social History Social History   Tobacco Use  . Smoking status: Never Smoker  . Smokeless tobacco: Never Used  Substance Use Topics  . Alcohol use: No  . Drug use: No     Allergies   Diltiazem and Nickel   Review of Systems Review of Systems  Constitutional: Positive for fatigue  and fever.  HENT: Positive for congestion.   Respiratory: Negative for wheezing.   Musculoskeletal: Positive for myalgias.  Neurological: Positive for headaches.     Physical Exam Triage Vital Signs ED Triage Vitals  Enc Vitals Group     BP 03/08/18 1137 107/80     Pulse Rate 03/08/18 1137 86     Resp 03/08/18 1137 16     Temp 03/08/18 1137 99.6 F (37.6 C)     Temp Source 03/08/18 1137 Oral     SpO2 03/08/18 1137 100 %     Weight 03/08/18 1138 169 lb (76.7 kg)     Height 03/08/18 1138 5\' 2"  (1.575 m)     Head Circumference --      Peak Flow --      Pain Score 03/08/18 1138 8     Pain Loc --      Pain Edu? --      Excl. in GC? --    No data found.  Updated Vital Signs BP 107/80 (BP Location: Left Arm)   Pulse 86   Temp 99.6 F (37.6 C) (Oral)   Resp 16   Ht 5\' 2"  (1.575 m)   Wt 76.7 kg   LMP 02/26/2018   SpO2 100%   BMI 30.91 kg/m   Visual Acuity Right Eye Distance:   Left Eye Distance:   Bilateral Distance:    Right Eye  Near:   Left Eye Near:    Bilateral Near:     Physical Exam Vitals signs and nursing note reviewed.  Constitutional:      General: She is not in acute distress.    Appearance: She is well-developed. She is not toxic-appearing or diaphoretic.  HENT:     Head: Normocephalic and atraumatic.     Right Ear: Tympanic membrane, ear canal and external ear normal.     Left Ear: Tympanic membrane, ear canal and external ear normal.     Nose: Rhinorrhea present.     Mouth/Throat:     Pharynx: Uvula midline. Posterior oropharyngeal erythema present. No oropharyngeal exudate.  Eyes:     General: No scleral icterus.       Right eye: No discharge.        Left eye: No discharge.     Conjunctiva/sclera: Conjunctivae normal.     Pupils: Pupils are equal, round, and reactive to light.  Neck:     Musculoskeletal: Normal range of motion and neck supple.     Thyroid: No thyromegaly.  Cardiovascular:     Rate and Rhythm: Normal rate and regular rhythm.     Heart sounds: Normal heart sounds.  Pulmonary:     Effort: Pulmonary effort is normal. No respiratory distress.     Breath sounds: Normal breath sounds. No stridor. No wheezing, rhonchi or rales.  Lymphadenopathy:     Cervical: No cervical adenopathy.      UC Treatments / Results  Labs (all labs ordered are listed, but only abnormal results are displayed) Labs Reviewed - No data to display  EKG None  Radiology No results found.  Procedures Procedures (including critical care time)  Medications Ordered in UC Medications - No data to display  Initial Impression / Assessment and Plan / UC Course  I have reviewed the triage vital signs and the nursing notes.  Pertinent labs & imaging results that were available during my care of the patient were reviewed by me and considered in my medical decision making (see chart for details).  Final Clinical Impressions(s) / UC Diagnoses   Final diagnoses:  Influenza-like illness     ED Prescriptions    Medication Sig Dispense Auth. Provider   oseltamivir (TAMIFLU) 75 MG capsule Take 1 capsule (75 mg total) by mouth 2 (two) times daily. 10 capsule Payton Mccallum, MD      1. diagnosis reviewed with patient 2. rx as per orders above; reviewed possible side effects, interactions, risks and benefits  3. Recommend supportive treatment with rest, fluids, otc analgesics prn 4. Follow-up prn if symptoms worsen or don't improve  Controlled Substance Prescriptions [place] Controlled Substance Registry consulted? Not Applicable   Payton Mccallum, MD 03/08/18 1316

## 2018-03-24 ENCOUNTER — Ambulatory Visit
Admission: EM | Admit: 2018-03-24 | Discharge: 2018-03-24 | Disposition: A | Discharge status: Home / Self Care | Payer: 59 | Attending: Family Medicine | Admitting: Family Medicine

## 2018-03-24 ENCOUNTER — Other Ambulatory Visit: Payer: Self-pay

## 2018-03-24 ENCOUNTER — Encounter: Payer: Self-pay | Admitting: Emergency Medicine

## 2018-03-24 DIAGNOSIS — E162 Hypoglycemia, unspecified: Secondary | ICD-10-CM | POA: Insufficient documentation

## 2018-03-24 HISTORY — DX: Hypoglycemia, unspecified: E16.2

## 2018-03-24 LAB — GLUCOSE, CAPILLARY: GLUCOSE-CAPILLARY: 64 mg/dL — AB (ref 70–99)

## 2018-03-24 NOTE — ED Provider Notes (Signed)
[facility]    CSN: [id number] Arrival date & time: 03/24/18  1427     History   Chief Complaint Chief Complaint  Patient presents with  . Hyperglycemia    HPI Jackie Riley is a 33 y.o. female.   33 yo female with a c/o hypoglycemic episode today at work. States she has a h/o hypoglycemia. Currently denies any symptoms.   The history is provided by the patient.  Hyperglycemia    Past Medical History:  Diagnosis Date  . Acid reflux   . Complication of anesthesia    "I don;t wake up easily"  . Headache(784.0)   . Heart palpitations   . Heart palpitations   . Hiatal hernia   . Hypoglycemia   . Pelvic pain     There are no active problems to display for this patient.   Past Surgical History:  Procedure Laterality Date  . COLONOSCOPY    . CYSTOSCOPY    . KNEE SURGERY     bilateral  . LAPAROSCOPY    . UPPER GASTROINTESTINAL ENDOSCOPY      OB History    Gravida  0   Para      Term      Preterm      AB      Living        SAB      TAB      Ectopic      Multiple      Live Births               Home Medications    Prior to Admission medications   Medication Sig Start Date End Date Taking? Authorizing Provider  fexofenadine (ALLEGRA) 180 MG tablet Take 180 mg by mouth daily.   Yes [provider]  norethindrone-ethinyl estradiol (JUNEL FE,GILDESS FE,LOESTRIN FE) 1-20 MG-MCG tablet Take 1 tablet by mouth daily.   Yes [provider]  omeprazole (PRILOSEC) 20 MG capsule Take 20 mg by mouth 2 (two) times daily before a meal.   Yes [provider]  fluticasone (FLONASE) 50 MCG/ACT nasal spray Place 2 sprays into both nostrils daily. 03/24/17   Lutricia Feiloemer, William P, PA-C  meloxicam (MOBIC) 7.5 MG tablet Take 1 tablet (7.5 mg total) by mouth daily. Take with food 08/16/17   Allegra GranaArnett, Margaret G, FNP  oseltamivir (TAMIFLU) 75 MG capsule Take 1 capsule (75 mg total) by mouth 2 (two) times daily. 03/08/18   Payton Mccallumonty,  Asli Tokarski, MD  OVER THE COUNTER MEDICATION Take 1 tablet by mouth daily. Patient takes over the counter Allegra 180mg     [provider]  Pseudoephedrine-Guaifenesin (MUCINEX D MAX STRENGTH) [phone] MG TB12 Take 1 tablet by mouth 2 (two) times daily. 03/24/17   Lutricia Feiloemer, William P, PA-C    Family History Family History  Problem Relation Age of Onset  . Diabetes Mother   . Hypertension Mother   . Healthy Father     Social History Social History   Tobacco Use  . Smoking status: Never Smoker  . Smokeless tobacco: Never Used  Substance Use Topics  . Alcohol use: No  . Drug use: No     Allergies   Diltiazem and Nickel   Review of Systems Review of Systems   Physical Exam Triage Vital Signs ED Triage Vitals  Enc Vitals Group     BP 03/24/18 1553 113/80     Pulse Rate 03/24/18 1553 84     Resp 03/24/18 1553 16  Temp 03/24/18 1553 98.8 F (37.1 C)     Temp Source 03/24/18 1553 Oral     SpO2 03/24/18 1553 100 %     Weight 03/24/18 1549 169 lb (76.7 kg)     Height 03/24/18 1549 5\' 2"  (1.575 m)     Head Circumference --      Peak Flow --      Pain Score 03/24/18 1548 6     Pain Loc --      Pain Edu? --      Excl. in GC? --    No data found.  Updated Vital Signs BP 113/80   Pulse 84   Temp 98.8 F (37.1 C) (Oral)   Resp 16   Ht 5\' 2"  (1.575 m)   Wt 76.7 kg   LMP 02/26/2018 (Exact Date)   SpO2 100%   BMI 30.91 kg/m   Visual Acuity Right Eye Distance:   Left Eye Distance:   Bilateral Distance:    Right Eye Near:   Left Eye Near:    Bilateral Near:     Physical Exam Vitals signs and nursing note reviewed.  Constitutional:      General: She is not in acute distress.    Appearance: She is well-developed. She is not toxic-appearing or diaphoretic.  HENT:     Head: Normocephalic and atraumatic.     Mouth/Throat:     Mouth: Mucous membranes are moist.     Pharynx: Uvula midline.  Neck:     Musculoskeletal: Normal range of motion and neck  supple.     Thyroid: No thyromegaly.  Cardiovascular:     Rate and Rhythm: Normal rate and regular rhythm.     Heart sounds: Normal heart sounds.  Pulmonary:     Effort: Pulmonary effort is normal. No respiratory distress.     Breath sounds: Normal breath sounds. No stridor. No wheezing, rhonchi or rales.  Lymphadenopathy:     Cervical: No cervical adenopathy.  Neurological:     Mental Status: She is alert.      UC Treatments / Results  Labs (all labs ordered are listed, but only abnormal results are displayed) Labs Reviewed  GLUCOSE, CAPILLARY - Abnormal; Notable for the following components:      Result Value   Glucose-Capillary 64 (*)    All other components within normal limits  CBG MONITORING, ED    EKG None  Radiology No results found.  Procedures Procedures (including critical care time)  Medications Ordered in UC Medications - No data to display  Initial Impression / Assessment and Plan / UC Course  I have reviewed the triage vital signs and the nursing notes.  Pertinent labs & imaging results that were available during my care of the patient were reviewed by me and considered in my medical decision making (see chart for details).      Final Clinical Impressions(s) / UC Diagnoses   Final diagnoses:  Hypoglycemia  (resolved)  ED Prescriptions    None      1. Lab results and diagnosis reviewed with patient 2.Recommend supportive treatment with small frequent meals 3. Follow-up prn if symptoms worsen or don't improve   Controlled Substance Prescriptions [employer] Controlled Substance Registry consulted? Not Applicable   Payton Mccallumonty, Elasia Furnish, MD 03/24/18 1754

## 2018-03-24 NOTE — ED Triage Notes (Signed)
Patient states she had to leave work because she felt her blood sugar had dropped.

## 2018-09-04 IMAGING — CR DG FOOT COMPLETE 3+V*R*
3 series · 3 of 3 positions shown · non-contrast
Comparison: None

CLINICAL DATA: Nontraumatic great toe pain.

EXAM:
RIGHT FOOT COMPLETE - 3+ VIEW

[foot ap]
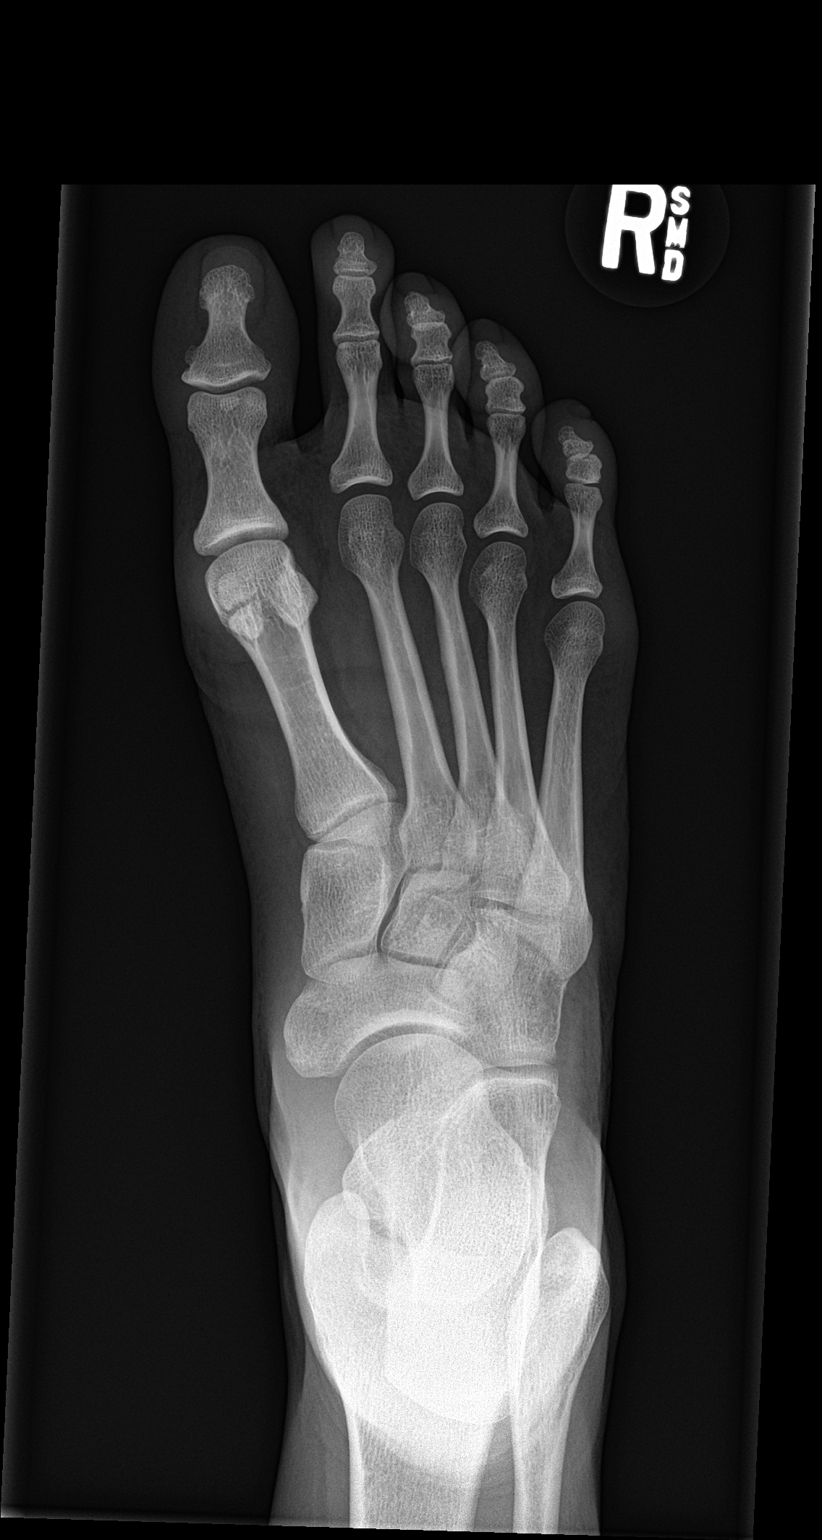

[foot obl]
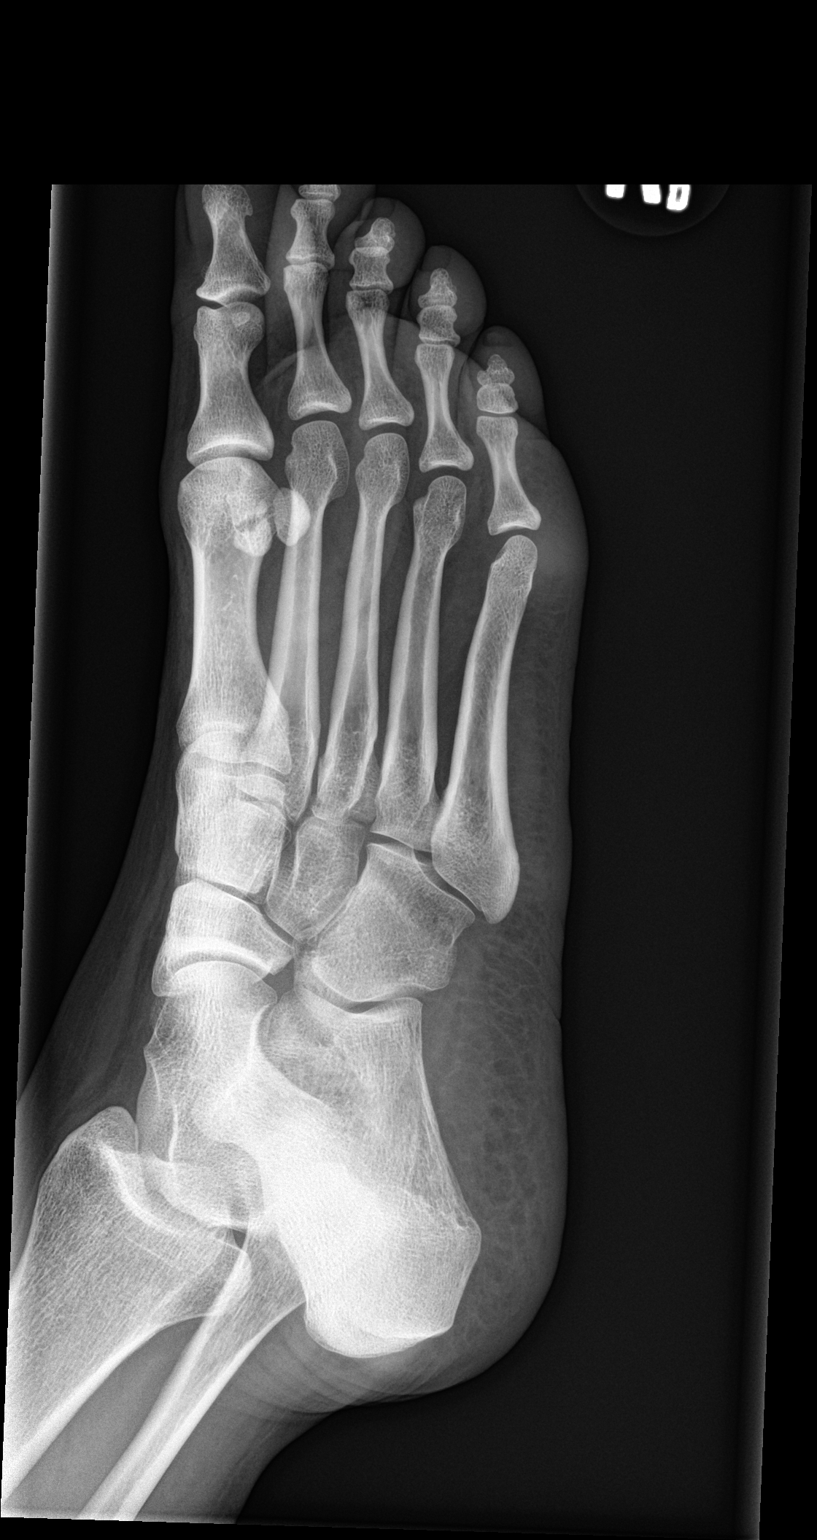

[foot lat]
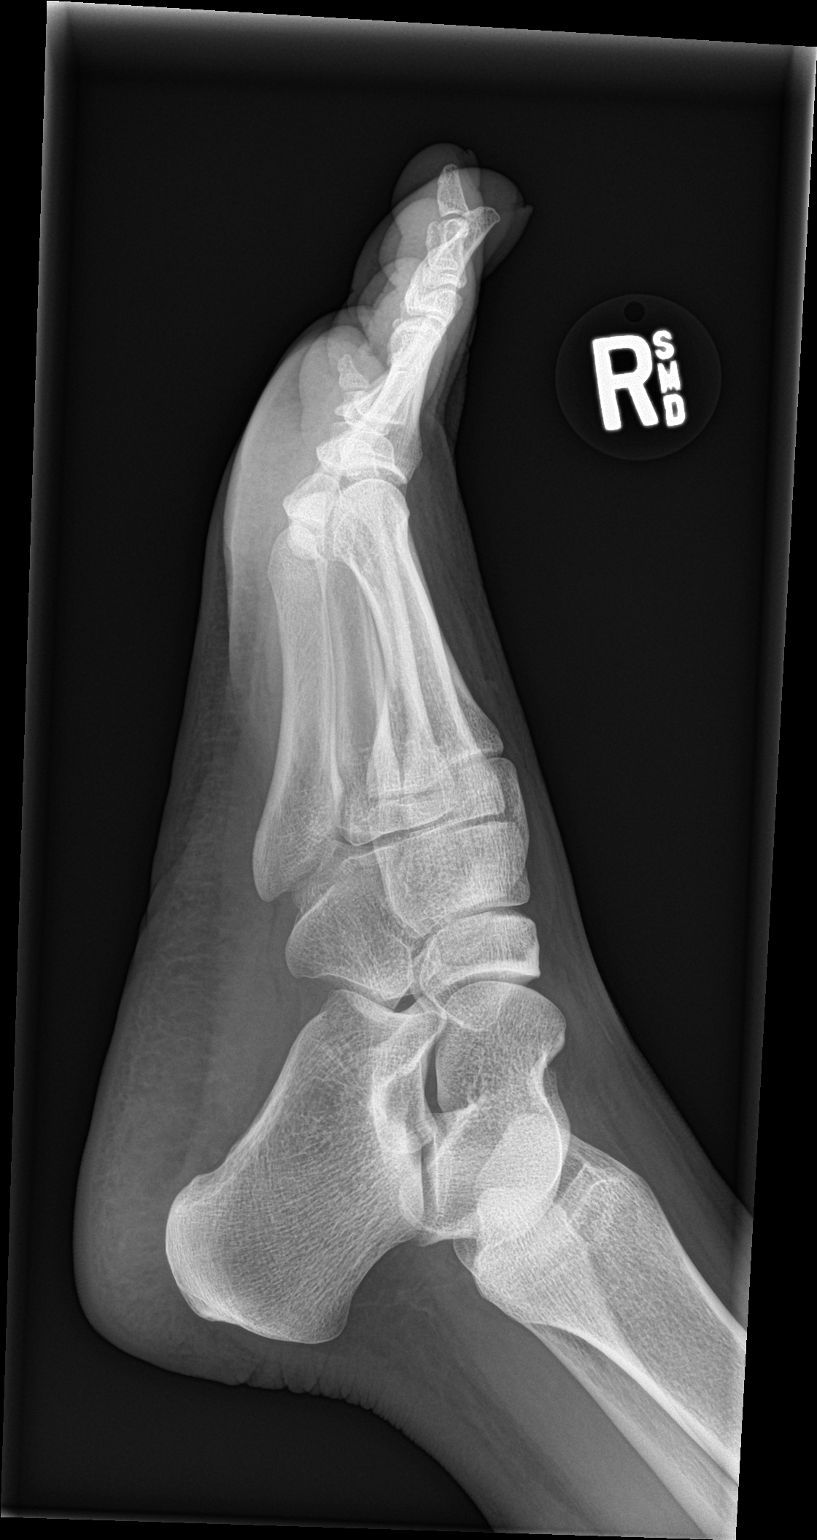

[3 of 3 positions shown; findings below may reference images not displayed]

FINDINGS: No fractures or dislocations. No cause for the patient's great toe
pain identified.
IMPRESSION: Negative.

## 2018-12-01 ENCOUNTER — Other Ambulatory Visit: Payer: Self-pay

## 2018-12-01 DIAGNOSIS — Z20822 Contact with and (suspected) exposure to covid-19: Secondary | ICD-10-CM

## 2018-12-03 LAB — NOVEL CORONAVIRUS, NAA: SARS-CoV-2, NAA: NOT DETECTED

## 2018-12-13 ENCOUNTER — Ambulatory Visit
Admission: EM | Admit: 2018-12-13 | Discharge: 2018-12-13 | Disposition: A | Discharge status: Home / Self Care | Payer: 59

## 2018-12-13 ENCOUNTER — Encounter: Payer: Self-pay | Admitting: Emergency Medicine

## 2018-12-13 ENCOUNTER — Other Ambulatory Visit: Payer: Self-pay

## 2018-12-13 DIAGNOSIS — H109 Unspecified conjunctivitis: Secondary | ICD-10-CM | POA: Diagnosis not present

## 2018-12-13 MED ORDER — MOXIFLOXACIN HCL 0.5 % OP SOLN: 1.0000 [drp] | Freq: Three times a day (TID) | OPHTHALMIC | 0 refills | Status: AC

## 2018-12-13 NOTE — Discharge Instructions (Signed)
Take medication as prescribed. Hand hygiene.   Follow up with your primary care physician this week as needed. Return to Urgent care for new or worsening concerns.

## 2018-12-13 NOTE — ED Provider Notes (Signed)
[facility] ____________________________________________  Time seen: Approximately 3:10 PM  I have reviewed the triage vital signs and the nursing notes.   HISTORY  Chief Complaint Conjunctivitis  HPI Jackie Riley is a 33 y.o. female presenting for evaluation of left eye itching, redness and irritation.  Patient reports she felt fine Saturday night going to bed, but woke up yesterday morning with left eye redness and itching.  Reports that continued throughout the day and then today woke up with matting to left eye, some greenish drainage with continued itching and irritation.  States also starting to notice similar in the right eye.  Denies accompanying cough, congestion, sore throat, fevers.  Denies photophobia, vision loss or foreign body sensation.  Occasional blurriness to left eye with drainage present.  Denies injury.  No abrupt onset.  Tried some leftover antibiotic eyedrops without resolution, believes it may have been Polytrim.  Does not wear glasses or contacts.  No others with similar around her.  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  Reports otherwise doing well.  Denies pregnancy.  Orland Mustard, MD: PCP   Past Medical History:  Diagnosis Date  . Acid reflux   . Complication of anesthesia    "I don;t wake up easily"  . Headache(784.0)   . Heart palpitations   . Heart palpitations   . Hiatal hernia   . Hypoglycemia   . Pelvic pain     There are no active problems to display for this patient.   Past Surgical History:  Procedure Laterality Date  . COLONOSCOPY    . CYSTOSCOPY    . KNEE SURGERY     bilateral  . LAPAROSCOPY    . UPPER GASTROINTESTINAL ENDOSCOPY       No current facility-administered medications for this encounter.   Current Outpatient Medications:  .  acarbose (PRECOSE) 50 MG tablet, , Disp: , Rfl:  .  fexofenadine (ALLEGRA) 180 MG tablet, Take 180 mg by mouth daily., Disp: , Rfl:  .  fluticasone  (FLONASE) 50 MCG/ACT nasal spray, Place 2 sprays into both nostrils daily., Disp: 16 g, Rfl: 0 .  norethindrone-ethinyl estradiol (JUNEL FE,GILDESS FE,LOESTRIN FE) 1-20 MG-MCG tablet, Take 1 tablet by mouth daily., Disp: , Rfl:  .  omeprazole (PRILOSEC) 20 MG capsule, Take 20 mg by mouth 2 (two) times daily before a meal., Disp: , Rfl:  .  moxifloxacin (VIGAMOX) 0.5 % ophthalmic solution, Place 1 drop into both eyes 3 (three) times daily for 7 days., Disp: 3 mL, Rfl: 0 .  OVER THE COUNTER MEDICATION, Take 1 tablet by mouth daily. Patient takes over the counter Allegra 180mg , Disp: , Rfl:   Allergies Diltiazem and Nickel  Family History  Problem Relation Age of Onset  . Diabetes Mother   . Hypertension Mother   . Healthy Father     Social History Social History   Tobacco Use  . Smoking status: Never Smoker  . Smokeless tobacco: Never Used  Substance Use Topics  . Alcohol use: No  . Drug use: No    Review of Systems Constitutional: No fever Eyes: as above.  ENT: No sore throat. Cardiovascular: Denies chest pain. Respiratory: Denies shortness of breath. Musculoskeletal: Negative for back pain. Skin: Negative for rash.   ____________________________________________   PHYSICAL EXAM:  VITAL SIGNS: ED Triage Vitals  Enc Vitals Group     BP 12/13/18 1436 106/87     Pulse Rate 12/13/18 1436 89     Resp 12/13/18  1436 18     Temp 12/13/18 1436 98.4 F (36.9 C)     Temp Source 12/13/18 1436 Oral     SpO2 12/13/18 1436 100 %     Weight 12/13/18 1433 177 lb (80.3 kg)     Height --      Head Circumference --      Peak Flow --      Pain Score 12/13/18 1433 4     Pain Loc --      Pain Edu? --      Excl. in GC? --      Visual Acuity  Right Eye Distance: 20/40 Left Eye Distance: 20/40 Bilateral Distance: 20/40(w/o correction)  Constitutional: Alert and oriented. Well appearing and in no acute distress. Eyes: Left conjunctive diffuse mild injection, minimal injection  diffusely to right eye.no right eye drainage.  Mild yellowish crusting around left eye and scant left eye yellowish drainage.  No visual foreign body noted.  PERRL. EOMI. no pain with EOMs.  No surrounding tenderness, swelling or erythema bilaterally.  Nontender. ENT      Head: Normocephalic and atraumatic. Cardiovascular: Normal rate, regular rhythm. Grossly normal heart sounds.  Good peripheral circulation. Respiratory: Normal respiratory effort without tachypnea nor retractions. Breath sounds are clear and equal bilaterally. No wheezes, rales, rhonchi. Musculoskeletal: Steady gait.  Neurologic:  Normal speech and language. Speech is normal. No gait instability.  Skin:  Skin is warm, dry. Psychiatric: Mood and affect are normal. Speech and behavior are normal. Patient exhibits appropriate insight and judgment   ___________________________________________   LABS (all labs ordered are listed, but only abnormal results are displayed)  Labs Reviewed - No data to display   PROCEDURES Procedures   INITIAL IMPRESSION / ASSESSMENT AND PLAN / ED COURSE  Pertinent labs & imaging results that were available during my care of the patient were reviewed by me and considered in my medical decision making (see chart for details).  Well-appearing patient.  Suspect bacterial conjunctivitis, left eye no progressing to the right.  Will treat with Vigamox.  Encourage fluids, supportive care, good hand hygiene.  Work note given.Discussed indication, risks and benefits of medications with patient.   Discussed follow up and return parameters including no resolution or any worsening concerns. Patient verbalized understanding and agreed to plan.   ____________________________________________   FINAL CLINICAL IMPRESSION(S) / ED DIAGNOSES  Final diagnoses:  Bacterial conjunctivitis     ED Discharge Orders         Ordered    moxifloxacin (VIGAMOX) 0.5 % ophthalmic solution  3 times daily     12/13/18  1517           Note: This dictation was prepared with Dragon dictation along with smaller phrase technology. Any transcriptional errors that result from this process are unintentional.         Marylene Land, NP 12/13/18 1600

## 2018-12-13 NOTE — ED Triage Notes (Signed)
Patient in office c/o yesterday left eye was red,itchy and today crusted, red and pain   OTC: Old rx used in eye

## 2019-03-12 ENCOUNTER — Other Ambulatory Visit: Payer: Self-pay

## 2019-03-12 ENCOUNTER — Ambulatory Visit
Admission: EM | Admit: 2019-03-12 | Discharge: 2019-03-12 | Disposition: A | Discharge status: Home / Self Care | Payer: Managed Care, Other (non HMO) | Attending: Family Medicine | Admitting: Family Medicine

## 2019-03-12 ENCOUNTER — Encounter: Payer: Self-pay | Admitting: Emergency Medicine

## 2019-03-12 DIAGNOSIS — M545 Low back pain, unspecified: Secondary | ICD-10-CM

## 2019-03-12 DIAGNOSIS — M25561 Pain in right knee: Secondary | ICD-10-CM | POA: Diagnosis not present

## 2019-03-12 DIAGNOSIS — W010XXA Fall on same level from slipping, tripping and stumbling without subsequent striking against object, initial encounter: Secondary | ICD-10-CM | POA: Diagnosis not present

## 2019-03-12 MED ORDER — MELOXICAM 15 MG PO TABS: 15.0000 mg | ORAL_TABLET | Freq: Every day | ORAL | 0 refills | Status: AC | PRN

## 2019-03-12 NOTE — Discharge Instructions (Signed)
Rest.  Medication as prescribed.  If knee pain persists, you will need to see Ortho.  If you need additional care/follow up regarding this please follow up with [facility].  Take care  Dr. Adriana Simas

## 2019-03-12 NOTE — ED Triage Notes (Signed)
Patient states that she slipped on some food that was on the floor at work on Thursday 03/10/19.  Patient states that she fell and landed on her right knee.  Patient c/o pain in her right knee and in her right hip that goes down her right leg.  Patient denies taking any medicine for her pain today.

## 2019-03-12 NOTE — ED Provider Notes (Signed)
[facility]    CSN: [id number] Arrival date & time: 03/12/19  1255  History   Chief Complaint Chief Complaint  Patient presents with  . Worker's Comp Injury  . Hip Pain    right  . Knee Pain    right   HPI  34 year old female presents with the above complaints.  Patient states that she was at work on Thursday and slipped and fell on the floor.  She states she slipped on a piece of food.  Patient states that her legs split apart and she subsequently struck her right knee on the floor.  She reports ongoing pain in the right knee.  She also reports right-sided low back/buttock/right hip pain.  She rates her pain as 9/10 in severity.  She has not taken any medication for it today.  No relieving factors.  She states that she has had prior surgery on her hip and knee which made her concerned and thus brought her in for evaluation.  No other complaints.  Past Medical History:  Diagnosis Date  . Acid reflux   . Complication of anesthesia    "I don;t wake up easily"  . Headache(784.0)   . Heart palpitations   . Heart palpitations   . Hiatal hernia   . Hypoglycemia   . Pelvic pain    Past Surgical History:  Procedure Laterality Date  . COLONOSCOPY    . CYSTOSCOPY    . KNEE SURGERY       . LAPAROSCOPY    . UPPER GASTROINTESTINAL ENDOSCOPY      OB History    Gravida  0   Para      Term      Preterm      AB      Living        SAB      TAB      Ectopic      Multiple      Live Births               Home Medications    Prior to Admission medications   Medication Sig Start Date End Date Taking? Authorizing Provider  acarbose (PRECOSE) 50 MG tablet  11/28/18  Yes [provider]  fexofenadine (ALLEGRA) 180 MG tablet Take 180 mg by mouth daily.   Yes [provider]  norethindrone-ethinyl estradiol (JUNEL FE,GILDESS FE,LOESTRIN FE) 1-20 MG-MCG tablet Take 1 tablet by mouth daily.   Yes [provider]  fluticasone  (FLONASE) 50 MCG/ACT nasal spray Place 2 sprays into both nostrils daily. 03/24/17   Lutricia Feil, PA-C  meloxicam (MOBIC) 15 MG tablet Take 1 tablet (15 mg total) by mouth daily as needed for pain. 03/12/19   Tommie Sams, DO  omeprazole (PRILOSEC) 20 MG capsule Take 20 mg by mouth 2 (two) times daily before a meal.  03/12/19  [provider]    Family History Family History  Problem Relation Age of Onset  . Diabetes Mother   . Hypertension Mother   . Healthy Father     Social History Social History   Tobacco Use  . Smoking status: Never Smoker  . Smokeless tobacco: Never Used  Substance Use Topics  . Alcohol use: No  . Drug use: No     Allergies   Diltiazem and Nickel   Review of Systems Review of Systems  Constitutional: Negative.   Musculoskeletal:       Right knee pain, hip pain, back pain.  Physical Exam Triage Vital Signs ED Triage Vitals  Enc Vitals Group     BP 03/12/19 1309 113/80     Pulse Rate 03/12/19 1309 92     Resp 03/12/19 1309 14     Temp 03/12/19 1309 98.9 F (37.2 C)     Temp Source 03/12/19 1309 Oral     SpO2 03/12/19 1309 99 %     Weight 03/12/19 1306 180 lb (81.6 kg)     Height 03/12/19 1306 5\' 2"  (1.575 m)     Head Circumference --      Peak Flow --      Pain Score 03/12/19 1306 9     Pain Loc --      Pain Edu? --      Excl. in [place]? --    Updated Vital Signs BP 113/80 (BP Location: Right Arm)   Pulse 92   Temp 98.9 F (37.2 C) (Oral)   Resp 14   Ht 5\' 2"  (1.575 m)   Wt 81.6 kg   LMP 02/26/2019 (Approximate)   SpO2 99%   BMI 32.92 kg/m   Visual Acuity Right Eye Distance:   Left Eye Distance:   Bilateral Distance:    Right Eye Near:   Left Eye Near:    Bilateral Near:     Physical Exam Vitals and nursing note reviewed.  Constitutional:      General: She is not in acute distress.    Appearance: Normal appearance. She is not ill-appearing.  Eyes:     General:        Right eye: No discharge.         Left eye: No discharge.     Conjunctiva/sclera: Conjunctivae normal.  Cardiovascular:     Rate and Rhythm: Normal rate and regular rhythm.     Heart sounds: No murmur.  Pulmonary:     Effort: Pulmonary effort is normal.     Breath sounds: Normal breath sounds. No wheezing, rhonchi or rales.  Musculoskeletal:     Comments: Right knee: Medial joint line tenderness to palpation.  Ligaments intact.  Neurological:     Mental Status: She is alert.  Psychiatric:        Mood and Affect: Mood normal.        Behavior: Behavior normal.    UC Treatments / Results  Labs (all labs ordered are listed, but only abnormal results are displayed) Labs Reviewed - No data to display  EKG   Radiology No results found.  Procedures Procedures (including critical care time)  Medications Ordered in UC Medications - No data to display  Initial Impression / Assessment and Plan / UC Course  I have reviewed the triage vital signs and the nursing notes.  Pertinent labs & imaging results that were available during my care of the patient were reviewed by me and considered in my medical decision making (see chart for details).    34 year old female presents with an acute uncomplicated injury.  Workmen's Comp.  Meloxicam as directed.  Advised to follow-up with employee health and wellness if needs referral to orthopedics.  Supportive care.  Final Clinical Impressions(s) / UC Diagnoses   Final diagnoses:  Acute right-sided low back pain without sciatica  Acute pain of right knee     Discharge Instructions     Rest.  Medication as prescribed.  If knee pain persists, you will need to see Ortho.  If you need additional care/follow up regarding this please follow up with [facility].  Take care  Dr. Adriana Simas     ED Prescriptions    Medication Sig Dispense Auth. Provider   meloxicam (MOBIC) 15 MG tablet Take 1 tablet (15 mg total) by mouth daily as needed for pain. 14 tablet Tommie Sams, DO      PDMP not reviewed this encounter.   Tommie Sams, [place] 03/12/19 1441
